# Patient Record
Sex: Male | Born: 1935 | Race: White | Hispanic: No | Marital: Married | Smoking: Never smoker
Health system: Southern US, Community
[De-identification: ages and names within clinical notes are randomized; demographics above are authoritative.]

## PROBLEM LIST (undated history)

## (undated) DIAGNOSIS — E785 Hyperlipidemia, unspecified: Secondary | ICD-10-CM

## (undated) DIAGNOSIS — N400 Enlarged prostate without lower urinary tract symptoms: Secondary | ICD-10-CM

---

## 2011-10-24 ENCOUNTER — Ambulatory Visit: Payer: Self-pay | Admitting: Ophthalmology

## 2011-11-08 ENCOUNTER — Ambulatory Visit: Payer: Self-pay | Admitting: Ophthalmology

## 2012-01-03 ENCOUNTER — Ambulatory Visit: Payer: Self-pay | Admitting: Ophthalmology

## 2012-08-14 ENCOUNTER — Ambulatory Visit: Payer: Self-pay | Admitting: Internal Medicine

## 2012-10-23 ENCOUNTER — Encounter: Payer: Self-pay | Admitting: Neurology

## 2014-11-16 NOTE — Op Note (Signed)
PATIENT NAME:  Austin Ramos, Austin Ramos MR#:  [id number] DATE OF BIRTH:  1926/03/11  DATE OF PROCEDURE:  11/08/2011  PREOPERATIVE DIAGNOSIS: Visually significant cataract of the left eye.   POSTOPERATIVE DIAGNOSIS: Visually significant cataract of the left eye.   OPERATIVE PROCEDURE: Cataract extraction by phacoemulsification with implant of intraocular lens to left eye.   SURGEON: Galen ManilaWilliam Taylee Gunnells, MD.   ANESTHESIA:  1. Managed anesthesia care.  2. Topical tetracaine drops followed by 2% Xylocaine jelly applied in the preoperative holding area.   COMPLICATIONS: None.   TECHNIQUE:  Stop-and-chop    DESCRIPTION OF PROCEDURE: The patient was examined and consented in the preoperative holding area where the aforementioned topical anesthesia was applied to the left eye and then brought back to the Operating Room where the left eye was prepped and draped in the usual sterile ophthalmic fashion and a lid speculum was placed. A paracentesis was created with the side port blade and the anterior chamber was filled with viscoelastic. A near clear corneal incision was performed with the steel keratome. A continuous curvilinear capsulorrhexis was performed with a cystotome followed by the capsulorrhexis forceps. Hydrodissection and hydrodelineation were carried out with BSS on a blunt cannula. The lens was removed in a stop-and-chop technique and the remaining cortical material was removed with the irrigation-aspiration handpiece. The capsular bag was inflated with viscoelastic and the Technus ZCB00 14.5-diopter lens, serial number [id number] was placed in the capsular bag without complication. The remaining viscoelastic was removed from the eye with the irrigation-aspiration handpiece. The wounds were hydrated. The anterior chamber was flushed with Miostat and the eye was inflated to physiologic pressure. The wounds were found to be water tight. The eye was dressed with Vigamox. The patient was given protective  glasses to wear throughout the day and a shield with which to sleep tonight. The patient was also given drops with which to begin a drop regimen today and will follow-up with me in one day.   ____________________________ Jerilee FieldWilliam L. Nita Whitmire, MD wlp:drc D: 11/08/2011 12:17:09 ET T: 11/08/2011 12:37:14 ET JOB#: [id number]  cc: Kashden Deboy L. Alann Avey, MD, <Dictator> Jerilee FieldWILLIAM L Muriel Wilber MD ELECTRONICALLY SIGNED 11/10/2011 8:40

## 2014-11-16 NOTE — Op Note (Signed)
PATIENT NAME:  [name], Austin C MR#:  [id number] DATE OF BIRTH:  07/01/1926  DATE OF PROCEDURE:  01/03/2012  PREOPERATIVE DIAGNOSIS: Visually significant cataract of the right eye.   POSTOPERATIVE DIAGNOSIS: Visually significant cataract of the right eye.   OPERATIVE PROCEDURE: Cataract extraction by phacoemulsification with implant of intraocular lens to right eye.   SURGEON: Galen ManilaWilliam Happy Ky, MD.   ANESTHESIA:  1. Managed anesthesia care.  2. Topical tetracaine drops followed by 2% Xylocaine jelly applied in the preoperative holding area.   COMPLICATIONS: None.   TECHNIQUE:  Stop and chop.   DESCRIPTION OF PROCEDURE: The patient was examined and consented in the preoperative holding area where the aforementioned topical anesthesia was applied to the right eye and then brought back to the Operating Room where the right eye was prepped and draped in the usual sterile ophthalmic fashion and a lid speculum was placed. A paracentesis was created with the side port blade and the anterior chamber was filled with viscoelastic. A near clear corneal incision was performed with the steel keratome. A continuous curvilinear capsulorrhexis was performed with a cystotome followed by the capsulorrhexis forceps. Hydrodissection and hydrodelineation were carried out with BSS on a blunt cannula. The lens was removed in a stop and chop technique and the remaining cortical material was removed with the irrigation-aspiration handpiece. The capsular bag was inflated with viscoelastic and the Tecnis ZCB00 14.0-diopter lens, serial number [id number] was placed in the capsular bag without complication. The remaining viscoelastic was removed from the eye with the irrigation-aspiration handpiece. The wounds were hydrated. The anterior chamber was flushed with Miostat and the eye was inflated to physiologic pressure. The wounds were found to be water tight. The eye was dressed with Vigamox. The patient was given protective  glasses to wear throughout the day and a shield with which to sleep tonight. The patient was also given drops with which to begin a drop regimen today and will follow-up with me in one day.  ____________________________ Jerilee FieldWilliam L. Skyrah Krupp, MD wlp:slb D: 01/03/2012 12:34:06 ET T: 01/03/2012 13:07:41 ET JOB#: [id number]  cc: [name], MD, <Dictator> [name] ELECTRONICALLY SIGNED 01/05/2012 12:12

## 2019-08-26 ENCOUNTER — Other Ambulatory Visit: Payer: Self-pay | Admitting: Internal Medicine

## 2019-08-26 ENCOUNTER — Inpatient Hospital Stay
Admission: EM | Admit: 2019-08-26 | Discharge: 2019-08-30 | DRG: 552 | Disposition: A | Discharge status: Skilled Nursing Facility | Payer: Medicare Other | Source: Ambulatory Visit | Attending: Student | Admitting: Student

## 2019-08-26 ENCOUNTER — Encounter: Payer: Self-pay | Admitting: Emergency Medicine

## 2019-08-26 ENCOUNTER — Emergency Department: Payer: Medicare Other

## 2019-08-26 ENCOUNTER — Ambulatory Visit: Admission: RE | Admit: 2019-08-26 | Payer: Medicare Other | Source: Ambulatory Visit

## 2019-08-26 ENCOUNTER — Other Ambulatory Visit: Payer: Self-pay

## 2019-08-26 DIAGNOSIS — S0101XA Laceration without foreign body of scalp, initial encounter: Secondary | ICD-10-CM | POA: Diagnosis not present

## 2019-08-26 DIAGNOSIS — F0390 Unspecified dementia without behavioral disturbance: Secondary | ICD-10-CM | POA: Diagnosis present

## 2019-08-26 DIAGNOSIS — E785 Hyperlipidemia, unspecified: Secondary | ICD-10-CM | POA: Diagnosis present

## 2019-08-26 DIAGNOSIS — W19XXXA Unspecified fall, initial encounter: Secondary | ICD-10-CM

## 2019-08-26 DIAGNOSIS — W010XXA Fall on same level from slipping, tripping and stumbling without subsequent striking against object, initial encounter: Secondary | ICD-10-CM | POA: Diagnosis present

## 2019-08-26 DIAGNOSIS — N400 Enlarged prostate without lower urinary tract symptoms: Secondary | ICD-10-CM | POA: Diagnosis present

## 2019-08-26 DIAGNOSIS — N401 Enlarged prostate with lower urinary tract symptoms: Secondary | ICD-10-CM

## 2019-08-26 DIAGNOSIS — S22009A Unspecified fracture of unspecified thoracic vertebra, initial encounter for closed fracture: Secondary | ICD-10-CM | POA: Diagnosis present

## 2019-08-26 DIAGNOSIS — S22059A Unspecified fracture of T5-T6 vertebra, initial encounter for closed fracture: Secondary | ICD-10-CM | POA: Diagnosis present

## 2019-08-26 DIAGNOSIS — R634 Abnormal weight loss: Secondary | ICD-10-CM

## 2019-08-26 DIAGNOSIS — S22069A Unspecified fracture of T7-T8 vertebra, initial encounter for closed fracture: Secondary | ICD-10-CM | POA: Diagnosis present

## 2019-08-26 DIAGNOSIS — Z20822 Contact with and (suspected) exposure to covid-19: Secondary | ICD-10-CM | POA: Diagnosis present

## 2019-08-26 DIAGNOSIS — N3943 Post-void dribbling: Secondary | ICD-10-CM

## 2019-08-26 DIAGNOSIS — F0280 Dementia in other diseases classified elsewhere without behavioral disturbance: Secondary | ICD-10-CM

## 2019-08-26 DIAGNOSIS — R0781 Pleurodynia: Secondary | ICD-10-CM

## 2019-08-26 DIAGNOSIS — S22000A Wedge compression fracture of unspecified thoracic vertebra, initial encounter for closed fracture: Secondary | ICD-10-CM | POA: Diagnosis not present

## 2019-08-26 HISTORY — DX: Hyperlipidemia, unspecified: E78.5

## 2019-08-26 HISTORY — DX: Benign prostatic hyperplasia without lower urinary tract symptoms: N40.0

## 2019-08-26 LAB — CBC
HCT: 42.5 % (ref 39.0–52.0)
Hemoglobin: 14.5 g/dL (ref 13.0–17.0)
MCH: 30.8 pg (ref 26.0–34.0)
MCHC: 34.1 g/dL (ref 30.0–36.0)
MCV: 90.2 fL (ref 80.0–100.0)
Platelets: 214 10*3/uL (ref 150–400)
RBC: 4.71 MIL/uL (ref 4.22–5.81)
RDW: 13.4 % (ref 11.5–15.5)
WBC: 8 10*3/uL (ref 4.0–10.5)
nRBC: 0 % (ref 0.0–0.2)

## 2019-08-26 LAB — BASIC METABOLIC PANEL
Anion gap: 9 (ref 5–15)
BUN: 30 mg/dL — ABNORMAL HIGH (ref 8–23)
CO2: 26 mmol/L (ref 22–32)
Calcium: 9.5 mg/dL (ref 8.9–10.3)
Chloride: 100 mmol/L (ref 98–111)
Creatinine, Ser: 0.79 mg/dL (ref 0.61–1.24)
GFR calc Af Amer: >60 mL/min (ref >60)
GFR calc non Af Amer: >60 mL/min (ref >60)
Glucose, Bld: 114 mg/dL — ABNORMAL HIGH (ref 70–99)
Potassium: 4.7 mmol/L (ref 3.5–5.1)
Sodium: 135 mmol/L (ref 135–145)

## 2019-08-26 LAB — RESPIRATORY PANEL BY RT PCR (FLU A&B, COVID)
Influenza A by PCR: NEGATIVE
Influenza B by PCR: NEGATIVE
SARS Coronavirus 2 by RT PCR: NEGATIVE

## 2019-08-26 MED ORDER — GALANTAMINE HYDROBROMIDE 4 MG PO TABS
8.0000 mg | ORAL_TABLET | Freq: Two times a day (BID) | ORAL | Status: DC
  Administered 2019-08-27 – 2019-08-30 (×7): 8 mg via ORAL
  Filled 2019-08-26 (×8): qty 2

## 2019-08-26 MED ORDER — HYDROXYZINE HCL 25 MG PO TABS
25.0000 mg | ORAL_TABLET | Freq: Two times a day (BID) | ORAL | Status: DC
  Administered 2019-08-27 – 2019-08-30 (×7): 25 mg via ORAL
  Filled 2019-08-26 (×7): qty 1

## 2019-08-26 MED ORDER — TAMSULOSIN HCL 0.4 MG PO CAPS
0.4000 mg | ORAL_CAPSULE | Freq: Every day | ORAL | Status: DC
  Administered 2019-08-27 – 2019-08-30 (×4): 0.4 mg via ORAL
  Filled 2019-08-26 (×4): qty 1

## 2019-08-26 MED ORDER — IOHEXOL 300 MG/ML  SOLN
100.0000 mL | Freq: Once | INTRAMUSCULAR | Status: AC | PRN
  Administered 2019-08-26: 21:00:00 100 mL via INTRAVENOUS

## 2019-08-26 MED ORDER — PROPRANOLOL HCL 20 MG PO TABS
20.0000 mg | ORAL_TABLET | Freq: Every day | ORAL | Status: DC
  Administered 2019-08-28 – 2019-08-29 (×2): 20 mg via ORAL
  Filled 2019-08-26 (×4): qty 1

## 2019-08-26 MED ORDER — ACETAMINOPHEN 500 MG PO TABS
1000.0000 mg | ORAL_TABLET | Freq: Once | ORAL | Status: AC
  Administered 2019-08-26: 22:00:00 1000 mg via ORAL
  Filled 2019-08-26: qty 2

## 2019-08-26 MED ORDER — FENTANYL CITRATE (PF) 100 MCG/2ML IJ SOLN
12.5000 ug | Freq: Once | INTRAMUSCULAR | Status: AC
  Administered 2019-08-26: 12.5 ug via INTRAVENOUS
  Filled 2019-08-26: qty 2

## 2019-08-26 MED ORDER — ASCORBIC ACID 500 MG PO TABS
ORAL_TABLET | Freq: Every day | ORAL | Status: DC
  Administered 2019-08-27 – 2019-08-30 (×4): 500 mg via ORAL
  Filled 2019-08-26 (×4): qty 1

## 2019-08-26 MED ORDER — VITAMIN D 25 MCG (1000 UNIT) PO TABS
1000.0000 [IU] | ORAL_TABLET | Freq: Every day | ORAL | Status: DC
  Administered 2019-08-27 – 2019-08-30 (×4): 1000 [IU] via ORAL
  Filled 2019-08-26 (×4): qty 1

## 2019-08-26 MED ORDER — LIDOCAINE-EPINEPHRINE 2 %-1:100000 IJ SOLN
20.0000 mL | Freq: Once | INTRAMUSCULAR | Status: AC
  Administered 2019-08-26: 23:00:00 20 mL via INTRADERMAL
  Filled 2019-08-26: qty 1

## 2019-08-26 MED ORDER — FOLIC ACID 1 MG PO TABS
1.0000 mg | ORAL_TABLET | Freq: Every day | ORAL | Status: DC
  Administered 2019-08-27 – 2019-08-30 (×4): 1 mg via ORAL
  Filled 2019-08-26 (×4): qty 1

## 2019-08-26 NOTE — ED Notes (Signed)
Pt transported to CT ?

## 2019-08-26 NOTE — ED Triage Notes (Signed)
First Nurse Note:  Arrives from [facility] for ED evaluation .  Per Dr. Hyacinth Meeker, patient fell at home 1 week ago and has continued to have thoracic pain with movement, unable to roll over without pain.  Dr. Hyacinth Meeker had planned to send patient to [facility], but patient fell in the office, hitting head.  Laceration to head.  Bandage placed.  Bleeding controlled.

## 2019-08-26 NOTE — ED Notes (Signed)
TO CT scan--- °

## 2019-08-26 NOTE — ED Triage Notes (Signed)
Patient reports multiple falls in the last week. States he is now having soreness to chest and is having a hard time laying down due to pain. Patient also has laceration to back of head. Pressure dressing in place. Bleeding controlled.

## 2019-08-26 NOTE — ED Provider Notes (Addendum)
[facility] Emergency Department Provider Note  ____________________________________________   None    (approximate)  I have reviewed the triage vital signs and the nursing notes.   HISTORY  Chief Complaint Fall    HPI TRUMAINE Ramos is a 86 y.o. male here with fall.  The patient reportedly fell at home 1 week ago.  He states he lost his footing and fell backwards.  Since then, he has had persistently worsening back and anterior chest pain.  The pain has persistently worsened.  He saw his PCP and had a negative plain film.  However, he presented to his doctor again today for further evaluation, at which point a CT scan was ordered.  However, when leaving, due to the pain, he fell, tripped, and fell backwards, striking his head.  He has had mild increase in his pain since then.  No loss of consciousness.  No other medical complaints.  He is not on blood thinners.        Past Medical History:  Diagnosis Date  . BPH (benign prostatic hyperplasia)   . Hyperlipidemia     Patient Active Problem List   Diagnosis Date Noted  . Thoracic vertebral fracture (HCC) 08/26/2019    History reviewed. No pertinent surgical history.  Prior to Admission medications   Medication Sig Start Date End Date Taking? Authorizing Provider  Cholecalciferol 25 MCG (1000 UT) tablet Take 1,000 Units by mouth daily.   Yes [provider]  folic acid (FOLVITE) 1 MG tablet Take 1 mg by mouth daily. 05/31/19  Yes [provider]  galantamine (RAZADYNE) 8 MG tablet Take 8 mg by mouth 2 (two) times daily. 08/24/19  Yes [provider]  hydrOXYzine (ATARAX/VISTARIL) 25 MG tablet Take 25 mg by mouth 2 (two) times daily. 08/24/19  Yes [provider]  Multiple Vitamins-Minerals (EMERGEN-C VITAMIN C PO) Take 1 tablet by mouth daily at 12 noon.    Yes [provider]  NON FORMULARY Take 2 capsules by mouth daily before breakfast. Herbal supplement:  Triveratrol Gold    Yes [provider]  propranolol (INDERAL) 20 MG tablet Take 20 mg by mouth daily at 12 noon.  08/24/19  Yes [provider]  tamsulosin (FLOMAX) 0.4 MG CAPS capsule Take 0.4 mg by mouth daily at 12 noon.  08/06/19  Yes [provider]    Allergies Patient has no known allergies.  No family history on file.  Social History Social History   Tobacco Use  . Smoking status: Never Smoker  . Smokeless tobacco: Never Used  Substance Use Topics  . Alcohol use: Never  . Drug use: Never    Review of Systems  Review of Systems  Constitutional: Positive for fatigue. Negative for chills and fever.  HENT: Negative for sore throat.   Respiratory: Negative for shortness of breath.   Cardiovascular: Positive for chest pain.  Gastrointestinal: Negative for abdominal pain.  Genitourinary: Negative for flank pain.  Musculoskeletal: Positive for back pain and gait problem. Negative for neck pain.  Skin: Negative for rash and wound.  Allergic/Immunologic: Negative for immunocompromised state.  Neurological: Negative for weakness and numbness.  Hematological: Does not bruise/bleed easily.  All other systems reviewed and are negative.    ____________________________________________  PHYSICAL EXAM:      VITAL SIGNS: ED Triage Vitals  Enc Vitals Group     BP 08/26/19 1802 (!) 155/64     Pulse Rate 08/26/19 1802 (!) 58  Resp 08/26/19 1802 16     Temp 08/26/19 1802 98.1 F (36.7 C)     Temp Source 08/26/19 1802 Oral     SpO2 08/26/19 1802 97 %     Weight 08/26/19 1803 115 lb (52.2 kg)     Height 08/26/19 1803 5\' 10"  (1.778 m)     Head Circumference --      Peak Flow --      Pain Score 08/26/19 1803 4     Pain Loc --      Pain Edu? --      Excl. in [facility]? --      Physical Exam Vitals and nursing note reviewed.  Constitutional:      General: He is not in acute distress.    Appearance: He is well-developed.  HENT:     Head:  Normocephalic and atraumatic.  Eyes:     Conjunctiva/sclera: Conjunctivae normal.  Cardiovascular:     Rate and Rhythm: Normal rate and regular rhythm.     Heart sounds: Normal heart sounds. No murmur. No friction rub.     Comments: Moderate anterior chest wall tenderness. Pulmonary:     Effort: Pulmonary effort is normal. No respiratory distress.     Breath sounds: Normal breath sounds. No wheezing or rales.  Abdominal:     General: There is no distension.     Palpations: Abdomen is soft.     Tenderness: There is no abdominal tenderness.  Musculoskeletal:     Cervical back: Neck supple.     Comments: Moderate tenderness to palpation throughout the thoracic spine.  No step-offs or deformity.  Skin:    General: Skin is warm.     Capillary Refill: Capillary refill takes less than 2 seconds.  Neurological:     Mental Status: He is alert and oriented to person, place, and time.     Motor: No abnormal muscle tone.       ____________________________________________   LABS (all labs ordered are listed, but only abnormal results are displayed)  Labs Reviewed  BASIC METABOLIC PANEL - Abnormal; Notable for the following components:      Result Value   Glucose, Bld 114 (*)    BUN 30 (*)    All other components within normal limits  RESPIRATORY PANEL BY RT PCR (FLU A&B, COVID)  CBC  BASIC METABOLIC PANEL  CBC    ____________________________________________  EKG: Sinus bradycardia, ventricular rate 59.  PR 196, QRS 98, QTc 413.  No acute ST elevations or depressions. ________________________________________  RADIOLOGY All imaging, including plain films, CT scans, and ultrasounds, independently reviewed by me, and interpretations confirmed via formal radiology reads.  ED MD interpretation:   Chest x-ray: Negative CT head: No acute abnormality CT chest/abdomen/pelvis.  Nondisplaced fracture of the anterior inferior endplate of T6, compression deformity of T8 with 25% height  loss  Official radiology report(s): DG Chest 2 View  Result Date: 08/26/2019 CLINICAL DATA:  Chest pain, fall EXAM: CHEST - 2 VIEW COMPARISON:  None. FINDINGS: The heart size and mediastinal contours are within normal limits. Aortic knob calcifications. Elevation of the left hemidiaphragm with gastric bubble and air-filled loops of bowel. Degenerative changes in the midthoracic spine with anterior flowing osteophytes. IMPRESSION: No active cardiopulmonary disease. Electronically Signed   By: Prudencio Pair M.D.   On: 08/26/2019 18:45   CT HEAD WO CONTRAST  Result Date: 08/26/2019 CLINICAL DATA:  Head trauma, headache EXAM: CT HEAD WITHOUT CONTRAST TECHNIQUE: Contiguous axial images were obtained from  the base of the skull through the vertex without intravenous contrast. COMPARISON:  None. FINDINGS: Brain: No evidence of acute territorial infarction, hemorrhage, hydrocephalus,extra-axial collection or mass lesion/mass effect. There is dilatation the ventricles and sulci consistent with age-related atrophy. Low-attenuation changes in the deep white matter consistent with small vessel ischemia. Vascular: No hyperdense vessel or unexpected calcification. Skull: The skull is intact. No fracture or focal lesion identified. Sinuses/Orbits: The visualized paranasal sinuses and mastoid air cells are clear. The orbits and globes intact. Other: None IMPRESSION: No acute intracranial abnormality. Findings consistent with age related atrophy and chronic small vessel ischemia Electronically Signed   By: Jonna ClarkBindu  Avutu M.D.   On: 08/26/2019 18:39   CT Chest W Contrast  Result Date: 08/26/2019 CLINICAL DATA:  Abdominal trauma, fall 1 week ago, continued pain EXAM: CT CHEST WITH CONTRAST TECHNIQUE: Multidetector CT imaging of the chest was performed during intravenous contrast administration. CONTRAST:  100mL OMNIPAQUE IOHEXOL 300 MG/ML  SOLN COMPARISON:  None. FINDINGS: Cardiovascular: Normal heart size. No significant  pericardial fluid/thickening. Great vessels are normal in course and caliber. No evidence of acute thoracic aortic injury. No central pulmonary emboli. There is mild aneurysmal dilatation of the ascending intrathoracic aorta measuring 4 cm in maximum transverse dimension which tapers at the level of the aortic arch. Scattered aortic atherosclerosis is seen. Coronary artery calcifications are noted. Mediastinum/Nodes: No pneumomediastinum. No mediastinal hematoma. Unremarkable esophagus. No axillary, mediastinal or hilar lymphadenopathy. Lungs/Pleura:Minimal atelectasis or scarring is seen at the left lung base. There is elevation of the left hemidiaphragm. No pneumothorax. No pleural effusion. Musculoskeletal: There is an S-shaped scoliotic curvature of the thoracolumbar spine. There is a nondisplaced fracture seen through the anteroinferior endplate of the T6 vertebral body which extends through the mid vertebral body. No extension to the posterior elements is seen. No malalignment is noted. Superior compression deformity of the T8 vertebral bodies seen with less than 25% loss in height. No retropulsion of fragments is seen. There is diffuse osteopenia. Degenerative changes are seen through the thoracolumbar spine. Abdomen/pelvis: Hepatobiliary: Homogeneous hepatic attenuation without traumatic injury. No focal lesion. Gallbladder physiologically distended, no calcified stone. No biliary dilatation. Pancreas: No evidence for traumatic injury. Portions are partially obscured by adjacent bowel loops and paucity of intra-abdominal fat. No ductal dilatation or inflammation. Spleen: Homogeneous attenuation without traumatic injury. Normal in size. Adrenals/Urinary Tract: No adrenal hemorrhage. Kidneys demonstrate symmetric enhancement and excretion on delayed phase imaging. No evidence or renal injury. Ureters are well opacified proximal through mid portion. Bladder is physiologically distended without wall thickening.  Stomach/Bowel: Suboptimally assessed without enteric contrast, allowing for this, no evidence of bowel injury. Stomach physiologically distended. There are no dilated or thickened small or large bowel loops. Moderate stool burden. No evidence of mesenteric hematoma. No free air free fluid. Vascular/Lymphatic: No acute vascular injury. The abdominal aorta and IVC are intact. No evidence of retroperitoneal, abdominal, or pelvic adenopathy. Scattered aortic atherosclerosis is noted. For Reproductive: No acute abnormality. Other: No focal contusion or abnormality of the abdominal wall. Musculoskeletal: No acute fracture of the lumbar spine or bony pelvis. IMPRESSION: 1. Nondisplaced fracture through the anteroinferior endplate and mid body of T6. No extension to the posterior elements or malalignment. 2. Superior compression deformity of the T8 vertebral body with less than 25% loss in height. 3. No acute intrathoracic, abdominal, or pelvic injury. 4. 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 Recommend annual imaging followup by CTA. This recommendation follows 2010 ACCF/AHA/AATS/ACR/ASA/SCA/SCAI/SIR/STS/SVM Guidelines  for the Diagnosis and Management of Patients with Thoracic Aortic Disease. Circulation. 2010; 121: W098-J191: [id number]. Aortic aneurysm NOS (ICD10-I71.9) 5.  Aortic Atherosclerosis (ICD10-I70.0). Electronically Signed   By: Jonna ClarkBindu  Avutu M.D.   On: 08/26/2019 21:15   CT Cervical Spine Wo Contrast  Result Date: 08/26/2019 CLINICAL DATA:  Fall 1 week ago with persistent neck pain, initial encounter EXAM: CT CERVICAL SPINE WITHOUT CONTRAST TECHNIQUE: Multidetector CT imaging of the cervical spine was performed without intravenous contrast. Multiplanar CT image reconstructions were also generated. COMPARISON:  None. FINDINGS: Alignment: Within normal limits. Skull base and vertebrae: 7 cervical segments are well visualized. Vertebral body height is well maintained. Multilevel  facet hypertrophic changes are seen as well as osteophytic changes. No acute fracture or acute facet abnormality is noted. Disc space narrowing is seen throughout the cervical spine. Soft tissues and spinal canal: Vascular calcifications are seen. No focal hematoma is noted. No other soft tissue abnormality is noted. Upper chest: Visualized lung apices are within normal limits. Other: None IMPRESSION: Multilevel degenerative change without acute abnormality. Electronically Signed   By: Alcide CleverMark  Lukens M.D.   On: 08/26/2019 21:09   CT ABDOMEN PELVIS W CONTRAST  Result Date: 08/26/2019 CLINICAL DATA:  Abdominal trauma, fall 1 week ago, continued pain EXAM: CT CHEST WITH CONTRAST TECHNIQUE: Multidetector CT imaging of the chest was performed during intravenous contrast administration. CONTRAST:  100mL OMNIPAQUE IOHEXOL 300 MG/ML  SOLN COMPARISON:  None. FINDINGS: Cardiovascular: Normal heart size. No significant pericardial fluid/thickening. Great vessels are normal in course and caliber. No evidence of acute thoracic aortic injury. No central pulmonary emboli. There is mild aneurysmal dilatation of the ascending intrathoracic aorta measuring 4 cm in maximum transverse dimension which tapers at the level of the aortic arch. Scattered aortic atherosclerosis is seen. Coronary artery calcifications are noted. Mediastinum/Nodes: No pneumomediastinum. No mediastinal hematoma. Unremarkable esophagus. No axillary, mediastinal or hilar lymphadenopathy. Lungs/Pleura:Minimal atelectasis or scarring is seen at the left lung base. There is elevation of the left hemidiaphragm. No pneumothorax. No pleural effusion. Musculoskeletal: There is an S-shaped scoliotic curvature of the thoracolumbar spine. There is a nondisplaced fracture seen through the anteroinferior endplate of the T6 vertebral body which extends through the mid vertebral body. No extension to the posterior elements is seen. No malalignment is noted. Superior  compression deformity of the T8 vertebral bodies seen with less than 25% loss in height. No retropulsion of fragments is seen. There is diffuse osteopenia. Degenerative changes are seen through the thoracolumbar spine. Abdomen/pelvis: Hepatobiliary: Homogeneous hepatic attenuation without traumatic injury. No focal lesion. Gallbladder physiologically distended, no calcified stone. No biliary dilatation. Pancreas: No evidence for traumatic injury. Portions are partially obscured by adjacent bowel loops and paucity of intra-abdominal fat. No ductal dilatation or inflammation. Spleen: Homogeneous attenuation without traumatic injury. Normal in size. Adrenals/Urinary Tract: No adrenal hemorrhage. Kidneys demonstrate symmetric enhancement and excretion on delayed phase imaging. No evidence or renal injury. Ureters are well opacified proximal through mid portion. Bladder is physiologically distended without wall thickening. Stomach/Bowel: Suboptimally assessed without enteric contrast, allowing for this, no evidence of bowel injury. Stomach physiologically distended. There are no dilated or thickened small or large bowel loops. Moderate stool burden. No evidence of mesenteric hematoma. No free air free fluid. Vascular/Lymphatic: No acute vascular injury. The abdominal aorta and IVC are intact. No evidence of retroperitoneal, abdominal, or pelvic adenopathy. Scattered aortic atherosclerosis is noted. For Reproductive: No acute abnormality. Other: No focal contusion or abnormality of the abdominal wall. Musculoskeletal: No acute  fracture of the lumbar spine or bony pelvis. IMPRESSION: 1. Nondisplaced fracture through the anteroinferior endplate and mid body of T6. No extension to the posterior elements or malalignment. 2. Superior compression deformity of the T8 vertebral body with less than 25% loss in height. 3. No acute intrathoracic, abdominal, or pelvic injury. 4. Mild aneurysmal dilatation of the ascending  intrathoracic aorta measuring 4.0 cm. Recommend annual imaging followup by CTA. This recommendation follows 2010 ACCF/AHA/AATS/ACR/ASA/SCA/SCAI/SIR/STS/SVM Guidelines for the Diagnosis and Management of Patients with Thoracic Aortic Disease. Circulation. 2010; 121: P233-A076. Aortic aneurysm NOS (ICD10-I71.9) 5.  Aortic Atherosclerosis (ICD10-I70.0). Electronically Signed   By: Jonna Clark M.D.   On: 08/26/2019 21:15    ____________________________________________  PROCEDURES   Procedure(s) performed (including Critical Care):  Marland KitchenMarland KitchenLaceration Repair  Date/Time: 08/27/2019 1:23 AM Performed by: Shaune Pollack, MD Authorized by: Shaune Pollack, MD   Consent:    Consent obtained:  Verbal   Consent given by:  Patient   Risks discussed:  Infection, need for additional repair, pain, tendon damage, retained foreign body, vascular damage, poor cosmetic result, poor wound healing and nerve damage   Alternatives discussed:  Referral and delayed treatment Anesthesia (see MAR for exact dosages):    Anesthesia method:  Local infiltration   Local anesthetic:  Lidocaine 1% WITH epi Laceration details:    Location:  Scalp   Scalp location:  Occipital   Length (cm):  2 Repair type:    Repair type:  Simple Pre-procedure details:    Preparation:  Patient was prepped and draped in usual sterile fashion and imaging obtained to evaluate for foreign bodies Exploration:    Hemostasis achieved with:  Direct pressure   Wound exploration: wound explored through full range of motion and entire depth of wound probed and visualized   Treatment:    Area cleansed with:  Betadine   Amount of cleaning:  Extensive   Irrigation solution:  Sterile water   Irrigation volume:  250   Irrigation method:  Pressure wash Skin repair:    Repair method:  Staples   Number of staples:  3 Approximation:    Approximation:  Close Post-procedure details:    Dressing:  Antibiotic ointment   Patient tolerance of procedure:   Tolerated well, no immediate complications    ____________________________________________  INITIAL IMPRESSION / MDM / ASSESSMENT AND PLAN / ED COURSE  As part of my medical decision making, I reviewed the following data within the electronic MEDICAL RECORD NUMBER Nursing notes reviewed and incorporated, Old chart reviewed, Notes from prior ED visits, and [place] Controlled Substance Database       *Austin Ramos was evaluated in Emergency Department on 08/27/2019 for the symptoms described in the history of present illness. He was evaluated in the context of the global COVID-19 pandemic, which necessitated consideration that the patient might be at risk for infection with the SARS-CoV-2 virus that causes COVID-19. Institutional protocols and algorithms that pertain to the evaluation of patients at risk for COVID-19 are in a state of rapid change based on information released by regulatory bodies including the CDC and federal and state organizations. These policies and algorithms were followed during the patient's care in the ED.  Some ED evaluations and interventions may be delayed as a result of limited staffing during the pandemic.*     Medical Decision Making:  86 yo M here with back and anterior chest pain after fall x 2. Pt also with deep head laceration, surrounding contusion. Lac repaired, tetanus updated. CT  C/A/P shows nondispaced fx through T6, T8. Incidental aneurysm noted. CXR shows no acute abnormality. Discussed CT findings with Dr. Adriana Simas of NSGY. Will place in [facility] with outpt follow-up in 3 weeks. Otherwise, will admit for pain control, PT/OT given age, independent living status. ____________________________________________  FINAL CLINICAL IMPRESSION(S) / ED DIAGNOSES  Final diagnoses:  Fall, initial encounter  Compression fracture of thoracic vertebra, initial encounter, unspecified thoracic vertebral level (HCC)  Laceration of scalp, initial encounter     MEDICATIONS GIVEN DURING  THIS VISIT:  Medications  propranolol (INDERAL) tablet 20 mg (has no administration in time range)  galantamine (RAZADYNE) tablet 8 mg (has no administration in time range)  hydrOXYzine (ATARAX/VISTARIL) tablet 25 mg (has no administration in time range)  tamsulosin (FLOMAX) capsule 0.4 mg (has no administration in time range)  folic acid (FOLVITE) tablet 1 mg (has no administration in time range)  Cholecalciferol 1,000 Units (has no administration in time range)  Emergen-C Vitamin C PACK (has no administration in time range)  enoxaparin (LOVENOX) injection 40 mg (has no administration in time range)  0.9 %  sodium chloride infusion (has no administration in time range)  acetaminophen (TYLENOL) tablet 650 mg (has no administration in time range)    Or  acetaminophen (TYLENOL) suppository 650 mg (has no administration in time range)  traZODone (DESYREL) tablet 25 mg (has no administration in time range)  magnesium hydroxide (MILK OF MAGNESIA) suspension 30 mL (has no administration in time range)  ondansetron (ZOFRAN) tablet 4 mg (has no administration in time range)    Or  ondansetron (ZOFRAN) injection 4 mg (has no administration in time range)  morphine 2 MG/ML injection 2 mg (has no administration in time range)  oxyCODONE-acetaminophen (PERCOCET/ROXICET) 5-325 MG per tablet 1-2 tablet (has no administration in time range)  lidocaine-EPINEPHrine (XYLOCAINE W/EPI) 2 %-1:100000 (with pres) injection 20 mL (20 mLs Intradermal Given by Other 08/26/19 2311)  iohexol (OMNIPAQUE) 300 MG/ML solution 100 mL (100 mLs Intravenous Contrast Given 08/26/19 2046)  acetaminophen (TYLENOL) tablet 1,000 mg (1,000 mg Oral Given 08/26/19 2202)  fentaNYL (SUBLIMAZE) injection 12.5 mcg (12.5 mcg Intravenous Given 08/26/19 2336)     ED Discharge Orders    None       Note:  This document was prepared using Dragon voice recognition software and may include unintentional dictation errors.   Shaune Pollack,  MD 08/27/19 Helene Shoe    Shaune Pollack, MD 08/27/19 [phone]

## 2019-08-27 DIAGNOSIS — S0101XA Laceration without foreign body of scalp, initial encounter: Secondary | ICD-10-CM

## 2019-08-27 LAB — BASIC METABOLIC PANEL
Anion gap: 8 (ref 5–15)
BUN: 22 mg/dL (ref 8–23)
CO2: 26 mmol/L (ref 22–32)
Calcium: 8.9 mg/dL (ref 8.9–10.3)
Chloride: 102 mmol/L (ref 98–111)
Creatinine, Ser: 0.73 mg/dL (ref 0.61–1.24)
GFR calc Af Amer: >60 mL/min (ref >60)
GFR calc non Af Amer: >60 mL/min (ref >60)
Glucose, Bld: 98 mg/dL (ref 70–99)
Potassium: 3.9 mmol/L (ref 3.5–5.1)
Sodium: 136 mmol/L (ref 135–145)

## 2019-08-27 LAB — CBC
HCT: 41.4 % (ref 39.0–52.0)
Hemoglobin: 14 g/dL (ref 13.0–17.0)
MCH: 30.6 pg (ref 26.0–34.0)
MCHC: 33.8 g/dL (ref 30.0–36.0)
MCV: 90.4 fL (ref 80.0–100.0)
Platelets: 184 10*3/uL (ref 150–400)
RBC: 4.58 MIL/uL (ref 4.22–5.81)
RDW: 13.3 % (ref 11.5–15.5)
WBC: 8 10*3/uL (ref 4.0–10.5)
nRBC: 0 % (ref 0.0–0.2)

## 2019-08-27 MED ORDER — MAGNESIUM HYDROXIDE 400 MG/5ML PO SUSP: 30.0000 mL | Freq: Every day | ORAL | Status: DC | PRN

## 2019-08-27 MED ORDER — ACETAMINOPHEN 650 MG RE SUPP: 650.0000 mg | Freq: Four times a day (QID) | RECTAL | Status: DC | PRN

## 2019-08-27 MED ORDER — OXYCODONE-ACETAMINOPHEN 5-325 MG PO TABS: 1.0000 | ORAL_TABLET | ORAL | Status: DC | PRN

## 2019-08-27 MED ORDER — TRAZODONE HCL 50 MG PO TABS: 25.0000 mg | ORAL_TABLET | Freq: Every evening | ORAL | Status: DC | PRN

## 2019-08-27 MED ORDER — ENOXAPARIN SODIUM 40 MG/0.4ML ~~LOC~~ SOLN
40.0000 mg | SUBCUTANEOUS | Status: DC
  Administered 2019-08-27: 40 mg via SUBCUTANEOUS
  Filled 2019-08-27: qty 0.4

## 2019-08-27 MED ORDER — SODIUM CHLORIDE 0.9 % IV SOLN: INTRAVENOUS | Status: DC

## 2019-08-27 MED ORDER — ONDANSETRON HCL 4 MG PO TABS: 4.0000 mg | ORAL_TABLET | Freq: Four times a day (QID) | ORAL | Status: DC | PRN

## 2019-08-27 MED ORDER — ENSURE ENLIVE PO LIQD
237.0000 mL | Freq: Two times a day (BID) | ORAL | Status: DC
  Administered 2019-08-27 – 2019-08-30 (×5): 237 mL via ORAL

## 2019-08-27 MED ORDER — ACETAMINOPHEN 325 MG PO TABS
650.0000 mg | ORAL_TABLET | Freq: Four times a day (QID) | ORAL | Status: DC | PRN
  Administered 2019-08-28: 07:00:00 650 mg via ORAL
  Filled 2019-08-27: qty 2

## 2019-08-27 MED ORDER — ONDANSETRON HCL 4 MG/2ML IJ SOLN: 4.0000 mg | Freq: Four times a day (QID) | INTRAMUSCULAR | Status: DC | PRN

## 2019-08-27 MED ORDER — MORPHINE SULFATE (PF) 2 MG/ML IV SOLN
2.0000 mg | INTRAVENOUS | Status: DC | PRN
  Administered 2019-08-28: 2 mg via INTRAVENOUS
  Filled 2019-08-27: qty 1

## 2019-08-27 NOTE — H&P (Addendum)
[place] at [facility]   PATIENT NAME: Austin Ramos    MR#:  [id number]  DATE OF BIRTH:  Dec 28, 1925  DATE OF ADMISSION:  08/26/2019  PRIMARY CARE PHYSICIAN: Danella Penton, MD   REQUESTING/REFERRING PHYSICIAN: Shaune Pollack, MD CHIEF COMPLAINT:   Chief Complaint  Patient presents with  . Fall    HISTORY OF PRESENT ILLNESS:  Austin Ramos  is a 86 y.o. male with a known history of dyslipidemia and BPH, presented to the emergency room with acute onset of fall while he was at his primary care physician's office after coming out of the bathroom and turning them losing his balance and falling hitting his forehead.  There was no loss of consciousness, paresthesias other neurological deficits.  He then complained of mid back pain.  He had another fall about a week ago without significant injuries.  He complained of chest pain however after that he could not get out of bed and therefore he saw his PCP yesterday and was ordered chest CTA.  No nausea or vomiting or abdominal pain.  No dysuria, oliguria or hematuria or flank pain.  His chest pain with better.  No dysuria, oliguria or hematuria or flank pain.  Upon presentation to the emergency room, blood pressure was 155/64 with a pulse of 58 and otherwise normal vital signs.  Labs revealed a BUN of 30 and influenza a and B antigens as well as Covid nineteen 2-hour PCR came back negative.  The patient had an abdominal chest and cervical spine CT that showed: 1. Nondisplaced fracture through the anteroinferior endplate and mid body of T6. No extension to the posterior elements or malalignment. 2. Superior compression deformity of the T8 vertebral body with less than 25% loss in height. 3. No acute intrathoracic, abdominal, or pelvic injury. 4. 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 Recommend annual imaging followup by CTA. This recommendation follows 2010  ACCF/AHA/AATS/ACR/ASA/SCA/SCAI/SIR/STS/SVM Guidelines for the Diagnosis and Management of Patients with Thoracic Aortic Disease. Circulation. 2010; 121: A416-S063. Aortic aneurysm NOS. 5.  Aortic Atherosclerosis.  Dr. Adriana Simas was contacted about the patient and is aware.  The patient was given 1 g of p.o. Tylenol, 12.5 mg of IV fentanyl, 1 mg of folic acid and his galantamine, Inderal and Flomax.  He will be admitted to a medical bed for further evaluation and management.  PAST MEDICAL HISTORY:   Past Medical History:  Diagnosis Date  . BPH (benign prostatic hyperplasia)   . Hyperlipidemia     PAST SURGICAL HISTORY:  History reviewed. No pertinent surgical history.  No reported surgeries. SOCIAL HISTORY:   Social History   Tobacco Use  . Smoking status: Never Smoker  . Smokeless tobacco: Never Used  Substance Use Topics  . Alcohol use: Never    FAMILY HISTORY:  No family history on file.  No reported familial diseases.  DRUG ALLERGIES:  No Known Allergies  REVIEW OF SYSTEMS:   ROS As per history of present illness. All pertinent systems were reviewed above. Constitutional,  HEENT, cardiovascular, respiratory, GI, GU, musculoskeletal, neuro, psychiatric, endocrine,  integumentary and hematologic systems were reviewed and are otherwise  negative/unremarkable except for positive findings mentioned above in the HPI.   MEDICATIONS AT HOME:   Prior to Admission medications   Medication Sig Start Date End Date Taking? Authorizing Provider  Cholecalciferol 25 MCG (1000 UT) tablet Take 1,000 Units by mouth daily.   Yes [provider]  folic acid (FOLVITE) 1  MG tablet Take 1 mg by mouth daily. 05/31/19  Yes [provider]  galantamine (RAZADYNE) 8 MG tablet Take 8 mg by mouth 2 (two) times daily. 08/24/19  Yes [provider]  hydrOXYzine (ATARAX/VISTARIL) 25 MG tablet Take 25 mg by mouth 2 (two) times daily. 08/24/19  Yes [provider]    Multiple Vitamins-Minerals (EMERGEN-C VITAMIN C PO) Take 1 tablet by mouth daily at 12 noon.    Yes [provider]  NON FORMULARY Take 2 capsules by mouth daily before breakfast. Herbal supplement: Triveratrol Gold    Yes [provider]  propranolol (INDERAL) 20 MG tablet Take 20 mg by mouth daily at 12 noon.  08/24/19  Yes [provider]  tamsulosin (FLOMAX) 0.4 MG CAPS capsule Take 0.4 mg by mouth daily at 12 noon.  08/06/19  Yes [provider]      VITAL SIGNS:  Blood pressure (!) 148/45, pulse 62, temperature (!) 97.3 F (36.3 C), temperature source Oral, resp. rate 18, height 5\' 10"  (1.778 m), weight 52.2 kg, SpO2 98 %.  PHYSICAL EXAMINATION:  Physical Exam  GENERAL:  86 y.o.-year-old Caucasian patient lying in the bed with no acute distress.  EYES: Pupils equal, round, reactive to light and accommodation. No scleral icterus. Extraocular muscles intact.  HEENT: Head atraumatic, normocephalic. Oropharynx and nasopharynx clear.  NECK:  Supple, no jugular venous distention. No thyroid enlargement, no tenderness.  LUNGS: Normal breath sounds bilaterally, no wheezing, rales,rhonchi or crepitation. No use of accessory muscles of respiration.  CARDIOVASCULAR: Regular rate and rhythm, S1, S2 normal. No murmurs, rubs, or gallops.  ABDOMEN: Soft, nondistended, nontender. Bowel sounds present. No organomegaly or mass.  EXTREMITIES: No pedal edema, cyanosis, or clubbing.  NEUROLOGIC: Cranial nerves II through XII are intact. Muscle strength 5/5 in all extremities. Sensation intact. Gait not checked. Musculoskeletal: Mid and lower thoracic spine tenderness PSYCHIATRIC: The patient is alert and oriented x 3.  Normal affect and good eye contact. SKIN: No obvious rash, lesion, or ulcer.   LABORATORY PANEL:   CBC Recent Labs  Lab 08/26/19 1809  WBC 8.0  HGB 14.5  HCT 42.5  PLT 214    ------------------------------------------------------------------------------------------------------------------  Chemistries  Recent Labs  Lab 08/26/19 1809  NA 135  K 4.7  CL 100  CO2 26  GLUCOSE 114*  BUN 30*  CREATININE 0.79  CALCIUM 9.5   ------------------------------------------------------------------------------------------------------------------  Cardiac Enzymes No results for input(s): TROPONINI in the last 168 hours. ------------------------------------------------------------------------------------------------------------------  RADIOLOGY:  DG Chest 2 View  Result Date: 08/26/2019 CLINICAL DATA:  Chest pain, fall EXAM: CHEST - 2 VIEW COMPARISON:  None. FINDINGS: The heart size and mediastinal contours are within normal limits. Aortic knob calcifications. Elevation of the left hemidiaphragm with gastric bubble and air-filled loops of bowel. Degenerative changes in the midthoracic spine with anterior flowing osteophytes. IMPRESSION: No active cardiopulmonary disease. Electronically Signed   By: 10/24/2019 M.D.   On: 08/26/2019 18:45   CT HEAD WO CONTRAST  Result Date: 08/26/2019 CLINICAL DATA:  Head trauma, headache EXAM: CT HEAD WITHOUT CONTRAST TECHNIQUE: Contiguous axial images were obtained from the base of the skull through the vertex without intravenous contrast. COMPARISON:  None. FINDINGS: Brain: No evidence of acute territorial infarction, hemorrhage, hydrocephalus,extra-axial collection or mass lesion/mass effect. There is dilatation the ventricles and sulci consistent with age-related atrophy. Low-attenuation changes in the deep white matter consistent with small vessel ischemia. Vascular: No hyperdense vessel or unexpected calcification. Skull: The skull is intact. No fracture  or focal lesion identified. Sinuses/Orbits: The visualized paranasal sinuses and mastoid air cells are clear. The orbits and globes intact. Other: None IMPRESSION: No acute  intracranial abnormality. Findings consistent with age related atrophy and chronic small vessel ischemia Electronically Signed   By: Jonna Clark M.D.   On: 08/26/2019 18:39   CT Chest W Contrast  Result Date: 08/26/2019 CLINICAL DATA:  Abdominal trauma, fall 1 week ago, continued pain EXAM: CT CHEST WITH CONTRAST TECHNIQUE: Multidetector CT imaging of the chest was performed during intravenous contrast administration. CONTRAST:  OMNIPAQUE IOHEXOL 300 MG/ML  SOLN COMPARISON:  None. FINDINGS: Cardiovascular: Normal heart size. No significant pericardial fluid/thickening. Great vessels are normal in course and caliber. No evidence of acute thoracic aortic injury. No central pulmonary emboli. There is mild aneurysmal dilatation of the ascending intrathoracic aorta measuring 4 cm in maximum transverse dimension which tapers at the level of the aortic arch. Scattered aortic atherosclerosis is seen. Coronary artery calcifications are noted. Mediastinum/Nodes: No pneumomediastinum. No mediastinal hematoma. Unremarkable esophagus. No axillary, mediastinal or hilar lymphadenopathy. Lungs/Pleura:Minimal atelectasis or scarring is seen at the left lung base. There is elevation of the left hemidiaphragm. No pneumothorax. No pleural effusion. Musculoskeletal: There is an S-shaped scoliotic curvature of the thoracolumbar spine. There is a nondisplaced fracture seen through the anteroinferior endplate of the T6 vertebral body which extends through the mid vertebral body. No extension to the posterior elements is seen. No malalignment is noted. Superior compression deformity of the T8 vertebral bodies seen with less than 25% loss in height. No retropulsion of fragments is seen. There is diffuse osteopenia. Degenerative changes are seen through the thoracolumbar spine. Abdomen/pelvis: Hepatobiliary: Homogeneous hepatic attenuation without traumatic injury. No focal lesion. Gallbladder physiologically distended, no calcified  stone. No biliary dilatation. Pancreas: No evidence for traumatic injury. Portions are partially obscured by adjacent bowel loops and paucity of intra-abdominal fat. No ductal dilatation or inflammation. Spleen: Homogeneous attenuation without traumatic injury. Normal in size. Adrenals/Urinary Tract: No adrenal hemorrhage. Kidneys demonstrate symmetric enhancement and excretion on delayed phase imaging. No evidence or renal injury. Ureters are well opacified proximal through mid portion. Bladder is physiologically distended without wall thickening. Stomach/Bowel: Suboptimally assessed without enteric contrast, allowing for this, no evidence of bowel injury. Stomach physiologically distended. There are no dilated or thickened small or large bowel loops. Moderate stool burden. No evidence of mesenteric hematoma. No free air free fluid. Vascular/Lymphatic: No acute vascular injury. The abdominal aorta and IVC are intact. No evidence of retroperitoneal, abdominal, or pelvic adenopathy. Scattered aortic atherosclerosis is noted. For Reproductive: No acute abnormality. Other: No focal contusion or abnormality of the abdominal wall. Musculoskeletal: No acute fracture of the lumbar spine or bony pelvis. IMPRESSION: 1. Nondisplaced fracture through the anteroinferior endplate and mid body of T6. No extension to the posterior elements or malalignment. 2. Superior compression deformity of the T8 vertebral body with less than 25% loss in height. 3. No acute intrathoracic, abdominal, or pelvic injury. 4. 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 Recommend annual imaging followup by CTA. This recommendation follows 2010 ACCF/AHA/AATS/ACR/ASA/SCA/SCAI/SIR/STS/SVM Guidelines for the Diagnosis and Management of Patients with Thoracic Aortic Disease. Circulation. 2010; 121: F573-U202. Aortic aneurysm NOS (ICD10-I71.9) 5.  Aortic Atherosclerosis (ICD10-I70.0). Electronically Signed   By: Jonna Clark M.D.   On: 08/26/2019 21:15   CT Cervical Spine Wo Contrast  Result Date: 08/26/2019 CLINICAL DATA:  Fall 1 week ago with persistent neck pain, initial encounter EXAM: CT CERVICAL  SPINE WITHOUT CONTRAST TECHNIQUE: Multidetector CT imaging of the cervical spine was performed without intravenous contrast. Multiplanar CT image reconstructions were also generated. COMPARISON:  None. FINDINGS: Alignment: Within normal limits. Skull base and vertebrae: 7 cervical segments are well visualized. Vertebral body height is well maintained. Multilevel facet hypertrophic changes are seen as well as osteophytic changes. No acute fracture or acute facet abnormality is noted. Disc space narrowing is seen throughout the cervical spine. Soft tissues and spinal canal: Vascular calcifications are seen. No focal hematoma is noted. No other soft tissue abnormality is noted. Upper chest: Visualized lung apices are within normal limits. Other: None IMPRESSION: Multilevel degenerative change without acute abnormality. Electronically Signed   By: Alcide CleverMark  Lukens M.D.   On: 08/26/2019 21:09   CT ABDOMEN PELVIS W CONTRAST  Result Date: 08/26/2019 CLINICAL DATA:  Abdominal trauma, fall 1 week ago, continued pain EXAM: CT CHEST WITH CONTRAST TECHNIQUE: Multidetector CT imaging of the chest was performed during intravenous contrast administration. CONTRAST:  100mL OMNIPAQUE IOHEXOL 300 MG/ML  SOLN COMPARISON:  None. FINDINGS: Cardiovascular: Normal heart size. No significant pericardial fluid/thickening. Great vessels are normal in course and caliber. No evidence of acute thoracic aortic injury. No central pulmonary emboli. There is mild aneurysmal dilatation of the ascending intrathoracic aorta measuring 4 cm in maximum transverse dimension which tapers at the level of the aortic arch. Scattered aortic atherosclerosis is seen. Coronary artery calcifications are noted. Mediastinum/Nodes: No pneumomediastinum. No mediastinal hematoma.  Unremarkable esophagus. No axillary, mediastinal or hilar lymphadenopathy. Lungs/Pleura:Minimal atelectasis or scarring is seen at the left lung base. There is elevation of the left hemidiaphragm. No pneumothorax. No pleural effusion. Musculoskeletal: There is an S-shaped scoliotic curvature of the thoracolumbar spine. There is a nondisplaced fracture seen through the anteroinferior endplate of the T6 vertebral body which extends through the mid vertebral body. No extension to the posterior elements is seen. No malalignment is noted. Superior compression deformity of the T8 vertebral bodies seen with less than 25% loss in height. No retropulsion of fragments is seen. There is diffuse osteopenia. Degenerative changes are seen through the thoracolumbar spine. Abdomen/pelvis: Hepatobiliary: Homogeneous hepatic attenuation without traumatic injury. No focal lesion. Gallbladder physiologically distended, no calcified stone. No biliary dilatation. Pancreas: No evidence for traumatic injury. Portions are partially obscured by adjacent bowel loops and paucity of intra-abdominal fat. No ductal dilatation or inflammation. Spleen: Homogeneous attenuation without traumatic injury. Normal in size. Adrenals/Urinary Tract: No adrenal hemorrhage. Kidneys demonstrate symmetric enhancement and excretion on delayed phase imaging. No evidence or renal injury. Ureters are well opacified proximal through mid portion. Bladder is physiologically distended without wall thickening. Stomach/Bowel: Suboptimally assessed without enteric contrast, allowing for this, no evidence of bowel injury. Stomach physiologically distended. There are no dilated or thickened small or large bowel loops. Moderate stool burden. No evidence of mesenteric hematoma. No free air free fluid. Vascular/Lymphatic: No acute vascular injury. The abdominal aorta and IVC are intact. No evidence of retroperitoneal, abdominal, or pelvic adenopathy. Scattered aortic  atherosclerosis is noted. For Reproductive: No acute abnormality. Other: No focal contusion or abnormality of the abdominal wall. Musculoskeletal: No acute fracture of the lumbar spine or bony pelvis. IMPRESSION: 1. Nondisplaced fracture through the anteroinferior endplate and mid body of T6. No extension to the posterior elements or malalignment. 2. Superior compression deformity of the T8 vertebral body with less than 25% loss in height. 3. No acute intrathoracic, abdominal, or pelvic injury. 4. 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  Recommend annual imaging followup by CTA. This recommendation follows 2010 ACCF/AHA/AATS/ACR/ASA/SCA/SCAI/SIR/STS/SVM Guidelines for the Diagnosis and Management of Patients with Thoracic Aortic Disease. Circulation. 2010; 121: N235-T732. Aortic aneurysm NOS (ICD10-I71.9) 5.  Aortic Atherosclerosis (ICD10-I70.0). Electronically Signed   By: Prudencio Pair M.D.   On: 08/26/2019 21:15      IMPRESSION AND PLAN:   1.  Mechanical fall with subsequent T6 and T8 vertebral fracture.  The patient will be admitted to a medical bed.  Pain management will be provided.  Neurosurgery consult will be obtained in a.m. Dr. Lacinda Axon is aware about the patient.  Physical therapy consult will be obtained.  We will also obtain a case management consult as the patient will likely require rehabilitation post discharge as his wife will not be able to take care of him.  If he fails conservative management he may need kyphoplasty.  He was placed on chest bracing in the ER  2.  BPH.  We will continue Flomax.  3.  Dementia.  We will continue galantamine.  4.  DVT prophylaxis.  Subcutaneous Lovenox    All the records are reviewed and case discussed with ED provider. The plan of care was discussed in details with the patient (and family). I answered all questions. The patient agreed to proceed with the above mentioned plan. Further management will depend upon  hospital course.   CODE STATUS: Full code  TOTAL TIME TAKING CARE OF THIS PATIENT: 45 minutes.    Christel Mormon M.D on 08/27/2019 at 3:10 AM  [facility]   From 7 PM-7 AM, contact night-coverage [email or web address]  CC: Primary care physician; Rusty Aus, MD   Note: This dictation was prepared with Dragon dictation along with smaller phrase technology. Any transcriptional errors that result from this process are unintentional.

## 2019-08-27 NOTE — Consult Note (Signed)
Reason for Consult: Multiple compression fractures thoracic spine Referring Physician: Dr. Sonia SidePatel  Austin Ramos is an 86 y.o. male.  HPI: Patient is a 86 year old who reports he has been had difficulty with balance but has been a ambulating without assistive device up until a recent fall approximately 9 days ago.  He had a second fall and has had CT that shows compression fractures in the mid thoracic spine.  He reports that he cannot lay flat and feels better sitting straight up.  He has been sleeping in her recliner but without it reclined.  He denies bowel or bladder dysfunction.  He is seen with his wife today at bedside  Past Medical History:  Diagnosis Date  . BPH (benign prostatic hyperplasia)   . Hyperlipidemia     History reviewed. No pertinent surgical history.  No family history on file.  Social History:  reports that he has never smoked. He has never used smokeless tobacco. He reports that he does not drink alcohol or use drugs.  Allergies: No Known Allergies  Medications: I have reviewed the patient's current medications.  Results for orders placed or performed during the hospital encounter of 08/26/19 (from the past 48 hour(s))  Basic metabolic panel     Status: Abnormal   Collection Time: 08/26/19  6:09 PM  Result Value Ref Range   Sodium 135 135 - 145 mmol/L   Potassium 4.7 3.5 - 5.1 mmol/L   Chloride 100 98 - 111 mmol/L   CO2 26 22 - 32 mmol/L   Glucose, Bld 114 (H) 70 - 99 mg/dL   BUN 30 (H) 8 - 23 mg/dL   Creatinine, Ser 8.650.79 0.61 - 1.24 mg/dL   Calcium 9.5 8.9 - 78.410.3 mg/dL   GFR calc non Af Amer >60 >60 mL/min   GFR calc Af Amer >60 >60 mL/min   Anion gap 9 5 - 15    Comment: Performed at [facility], [address] [place], [place] [address]  CBC     Status: None   Collection Time: 08/26/19  6:09 PM  Result Value Ref Range   WBC 8.0 4.0 - 10.5 K/uL   RBC 4.71 4.22 - 5.81 MIL/uL   Hemoglobin 14.5 13.0 - 17.0 g/dL   HCT 52.842.5 41.339.0 - 24.452.0 %    MCV 90.2 80.0 - 100.0 fL   MCH 30.8 26.0 - 34.0 pg   MCHC 34.1 30.0 - 36.0 g/dL   RDW 01.013.4 27.211.5 - 53.615.5 %   Platelets 214 150 - 400 K/uL   nRBC 0.0 0.0 - 0.2 %    Comment: Performed at [facility], [address] [place], [place] [address]  Respiratory Panel by RT PCR (Flu A&B, Covid) - Nasopharyngeal Swab     Status: None   Collection Time: 08/26/19 10:25 PM   Specimen: Nasopharyngeal Swab  Result Value Ref Range   SARS Coronavirus 2 by RT PCR NEGATIVE NEGATIVE    Comment: (NOTE) SARS-CoV-2 target nucleic acids are NOT DETECTED. The SARS-CoV-2 RNA is generally detectable in upper respiratoy specimens during the acute phase of infection. The lowest concentration of SARS-CoV-2 viral copies this assay can detect is 131 copies/mL. A negative result does not preclude SARS-Cov-2 infection and should not be used as the sole basis for treatment or other patient management decisions. A negative result may occur with  improper specimen collection/handling, submission of specimen other than nasopharyngeal swab, presence of viral mutation(s) within the areas targeted by this assay, and inadequate  number of viral copies (<131 copies/mL). A negative result must be combined with clinical observations, patient history, and epidemiological information. The expected result is Negative. Fact Sheet for Patients:  PinkCheek.be Fact Sheet for Healthcare Providers:  GravelBags.it This test is not yet ap proved or cleared by the [place] FDA and  has been authorized for detection and/or diagnosis of SARS-CoV-2 by FDA under an Emergency Use Authorization (EUA). This EUA will remain  in effect (meaning this test can be used) for the duration of the COVID-19 declaration under Section 564(b)(1) of the Act, 21 U.S.C. section 360bbb-3(b)(1), unless the authorization is terminated or revoked sooner.    Influenza A by PCR NEGATIVE  NEGATIVE   Influenza B by PCR NEGATIVE NEGATIVE    Comment: (NOTE) The Xpert Xpress SARS-CoV-2/FLU/RSV assay is intended as an aid in  the diagnosis of influenza from Nasopharyngeal swab specimens and  should not be used as a sole basis for treatment. Nasal washings and  aspirates are unacceptable for Xpert Xpress SARS-CoV-2/FLU/RSV  testing. Fact Sheet for Patients: PinkCheek.be Fact Sheet for Healthcare Providers: GravelBags.it This test is not yet approved or cleared by the [place] FDA and  has been authorized for detection and/or diagnosis of SARS-CoV-2 by  FDA under an Emergency Use Authorization (EUA). This EUA will remain  in effect (meaning this test can be used) for the duration of the  Covid-19 declaration under Section 564(b)(1) of the Act, 21  U.S.C. section 360bbb-3(b)(1), unless the authorization is  terminated or revoked. Performed at [facility], [address] [place], [place] [address]   Basic metabolic panel     Status: None   Collection Time: 08/27/19  5:49 AM  Result Value Ref Range   Sodium 136 135 - 145 mmol/L   Potassium 3.9 3.5 - 5.1 mmol/L   Chloride 102 98 - 111 mmol/L   CO2 26 22 - 32 mmol/L   Glucose, Bld 98 70 - 99 mg/dL   BUN 22 8 - 23 mg/dL   Creatinine, Ser 0.73 0.61 - 1.24 mg/dL   Calcium 8.9 8.9 - 10.3 mg/dL   GFR calc non Af Amer >60 >60 mL/min   GFR calc Af Amer >60 >60 mL/min   Anion gap 8 5 - 15    Comment: Performed at [facility], [address] [place], [place] [address]  CBC     Status: None   Collection Time: 08/27/19  5:49 AM  Result Value Ref Range   WBC 8.0 4.0 - 10.5 K/uL   RBC 4.58 4.22 - 5.81 MIL/uL   Hemoglobin 14.0 13.0 - 17.0 g/dL   HCT 41.4 39.0 - 52.0 %   MCV 90.4 80.0 - 100.0 fL   MCH 30.6 26.0 - 34.0 pg   MCHC 33.8 30.0 - 36.0 g/dL   RDW 13.3 11.5 - 15.5 %   Platelets 184 150 - 400 K/uL   nRBC 0.0 0.0 - 0.2 %    Comment:  Performed at [facility], [address] [place], [place] [address]    DG Chest 2 View  Result Date: 08/26/2019 CLINICAL DATA:  Chest pain, fall EXAM: CHEST - 2 VIEW COMPARISON:  None. FINDINGS: The heart size and mediastinal contours are within normal limits. Aortic knob calcifications. Elevation of the left hemidiaphragm with gastric bubble and air-filled loops of bowel. Degenerative changes in the midthoracic spine with anterior flowing osteophytes. IMPRESSION: No active cardiopulmonary disease. Electronically Signed   By: Ebony Cargo.D.  On: 08/26/2019 18:45   CT HEAD WO CONTRAST  Result Date: 08/26/2019 CLINICAL DATA:  Head trauma, headache EXAM: CT HEAD WITHOUT CONTRAST TECHNIQUE: Contiguous axial images were obtained from the base of the skull through the vertex without intravenous contrast. COMPARISON:  None. FINDINGS: Brain: No evidence of acute territorial infarction, hemorrhage, hydrocephalus,extra-axial collection or mass lesion/mass effect. There is dilatation the ventricles and sulci consistent with age-related atrophy. Low-attenuation changes in the deep white matter consistent with small vessel ischemia. Vascular: No hyperdense vessel or unexpected calcification. Skull: The skull is intact. No fracture or focal lesion identified. Sinuses/Orbits: The visualized paranasal sinuses and mastoid air cells are clear. The orbits and globes intact. Other: None IMPRESSION: No acute intracranial abnormality. Findings consistent with age related atrophy and chronic small vessel ischemia Electronically Signed   By: Jonna Clark M.D.   On: 08/26/2019 18:39   CT Chest W Contrast  Result Date: 08/26/2019 CLINICAL DATA:  Abdominal trauma, fall 1 week ago, continued pain EXAM: CT CHEST WITH CONTRAST TECHNIQUE: Multidetector CT imaging of the chest was performed during intravenous contrast administration. CONTRAST:  OMNIPAQUE IOHEXOL 300 MG/ML  SOLN COMPARISON:  None. FINDINGS:  Cardiovascular: Normal heart size. No significant pericardial fluid/thickening. Great vessels are normal in course and caliber. No evidence of acute thoracic aortic injury. No central pulmonary emboli. There is mild aneurysmal dilatation of the ascending intrathoracic aorta measuring 4 cm in maximum transverse dimension which tapers at the level of the aortic arch. Scattered aortic atherosclerosis is seen. Coronary artery calcifications are noted. Mediastinum/Nodes: No pneumomediastinum. No mediastinal hematoma. Unremarkable esophagus. No axillary, mediastinal or hilar lymphadenopathy. Lungs/Pleura:Minimal atelectasis or scarring is seen at the left lung base. There is elevation of the left hemidiaphragm. No pneumothorax. No pleural effusion. Musculoskeletal: There is an S-shaped scoliotic curvature of the thoracolumbar spine. There is a nondisplaced fracture seen through the anteroinferior endplate of the T6 vertebral body which extends through the mid vertebral body. No extension to the posterior elements is seen. No malalignment is noted. Superior compression deformity of the T8 vertebral bodies seen with less than 25% loss in height. No retropulsion of fragments is seen. There is diffuse osteopenia. Degenerative changes are seen through the thoracolumbar spine. Abdomen/pelvis: Hepatobiliary: Homogeneous hepatic attenuation without traumatic injury. No focal lesion. Gallbladder physiologically distended, no calcified stone. No biliary dilatation. Pancreas: No evidence for traumatic injury. Portions are partially obscured by adjacent bowel loops and paucity of intra-abdominal fat. No ductal dilatation or inflammation. Spleen: Homogeneous attenuation without traumatic injury. Normal in size. Adrenals/Urinary Tract: No adrenal hemorrhage. Kidneys demonstrate symmetric enhancement and excretion on delayed phase imaging. No evidence or renal injury. Ureters are well opacified proximal through mid portion. Bladder is  physiologically distended without wall thickening. Stomach/Bowel: Suboptimally assessed without enteric contrast, allowing for this, no evidence of bowel injury. Stomach physiologically distended. There are no dilated or thickened small or large bowel loops. Moderate stool burden. No evidence of mesenteric hematoma. No free air free fluid. Vascular/Lymphatic: No acute vascular injury. The abdominal aorta and IVC are intact. No evidence of retroperitoneal, abdominal, or pelvic adenopathy. Scattered aortic atherosclerosis is noted. For Reproductive: No acute abnormality. Other: No focal contusion or abnormality of the abdominal wall. Musculoskeletal: No acute fracture of the lumbar spine or bony pelvis. IMPRESSION: 1. Nondisplaced fracture through the anteroinferior endplate and mid body of T6. No extension to the posterior elements or malalignment. 2. Superior compression deformity of the T8 vertebral body with less than 25% loss in height. 3.  No acute intrathoracic, abdominal, or pelvic injury. 4. 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 Recommend annual imaging followup by CTA. This recommendation follows 2010 ACCF/AHA/AATS/ACR/ASA/SCA/SCAI/SIR/STS/SVM Guidelines for the Diagnosis and Management of Patients with Thoracic Aortic Disease. Circulation. 2010; 121: N829-F621. Aortic aneurysm NOS (ICD10-I71.9) 5.  Aortic Atherosclerosis (ICD10-I70.0). Electronically Signed   By: Jonna Clark M.D.   On: 08/26/2019 21:15   CT Cervical Spine Wo Contrast  Result Date: 08/26/2019 CLINICAL DATA:  Fall 1 week ago with persistent neck pain, initial encounter EXAM: CT CERVICAL SPINE WITHOUT CONTRAST TECHNIQUE: Multidetector CT imaging of the cervical spine was performed without intravenous contrast. Multiplanar CT image reconstructions were also generated. COMPARISON:  None. FINDINGS: Alignment: Within normal limits. Skull base and vertebrae: 7 cervical segments are well visualized.  Vertebral body height is well maintained. Multilevel facet hypertrophic changes are seen as well as osteophytic changes. No acute fracture or acute facet abnormality is noted. Disc space narrowing is seen throughout the cervical spine. Soft tissues and spinal canal: Vascular calcifications are seen. No focal hematoma is noted. No other soft tissue abnormality is noted. Upper chest: Visualized lung apices are within normal limits. Other: None IMPRESSION: Multilevel degenerative change without acute abnormality. Electronically Signed   By: Alcide Clever M.D.   On: 08/26/2019 21:09   CT ABDOMEN PELVIS W CONTRAST  Result Date: 08/26/2019 CLINICAL DATA:  Abdominal trauma, fall 1 week ago, continued pain EXAM: CT CHEST WITH CONTRAST TECHNIQUE: Multidetector CT imaging of the chest was performed during intravenous contrast administration. CONTRAST:  OMNIPAQUE IOHEXOL 300 MG/ML  SOLN COMPARISON:  None. FINDINGS: Cardiovascular: Normal heart size. No significant pericardial fluid/thickening. Great vessels are normal in course and caliber. No evidence of acute thoracic aortic injury. No central pulmonary emboli. There is mild aneurysmal dilatation of the ascending intrathoracic aorta measuring 4 cm in maximum transverse dimension which tapers at the level of the aortic arch. Scattered aortic atherosclerosis is seen. Coronary artery calcifications are noted. Mediastinum/Nodes: No pneumomediastinum. No mediastinal hematoma. Unremarkable esophagus. No axillary, mediastinal or hilar lymphadenopathy. Lungs/Pleura:Minimal atelectasis or scarring is seen at the left lung base. There is elevation of the left hemidiaphragm. No pneumothorax. No pleural effusion. Musculoskeletal: There is an S-shaped scoliotic curvature of the thoracolumbar spine. There is a nondisplaced fracture seen through the anteroinferior endplate of the T6 vertebral body which extends through the mid vertebral body. No extension to the posterior elements  is seen. No malalignment is noted. Superior compression deformity of the T8 vertebral bodies seen with less than 25% loss in height. No retropulsion of fragments is seen. There is diffuse osteopenia. Degenerative changes are seen through the thoracolumbar spine. Abdomen/pelvis: Hepatobiliary: Homogeneous hepatic attenuation without traumatic injury. No focal lesion. Gallbladder physiologically distended, no calcified stone. No biliary dilatation. Pancreas: No evidence for traumatic injury. Portions are partially obscured by adjacent bowel loops and paucity of intra-abdominal fat. No ductal dilatation or inflammation. Spleen: Homogeneous attenuation without traumatic injury. Normal in size. Adrenals/Urinary Tract: No adrenal hemorrhage. Kidneys demonstrate symmetric enhancement and excretion on delayed phase imaging. No evidence or renal injury. Ureters are well opacified proximal through mid portion. Bladder is physiologically distended without wall thickening. Stomach/Bowel: Suboptimally assessed without enteric contrast, allowing for this, no evidence of bowel injury. Stomach physiologically distended. There are no dilated or thickened small or large bowel loops. Moderate stool burden. No evidence of mesenteric hematoma. No free air free fluid. Vascular/Lymphatic: No acute vascular injury. The abdominal aorta and IVC are intact.  No evidence of retroperitoneal, abdominal, or pelvic adenopathy. Scattered aortic atherosclerosis is noted. For Reproductive: No acute abnormality. Other: No focal contusion or abnormality of the abdominal wall. Musculoskeletal: No acute fracture of the lumbar spine or bony pelvis. IMPRESSION: 1. Nondisplaced fracture through the anteroinferior endplate and mid body of T6. No extension to the posterior elements or malalignment. 2. Superior compression deformity of the T8 vertebral body with less than 25% loss in height. 3. No acute intrathoracic, abdominal, or pelvic injury. 4. Mild  aneurysmal dilatation of the ascending intrathoracic aorta measuring 4.0 cm. Recommend annual imaging followup by CTA. This recommendation follows 2010 ACCF/AHA/AATS/ACR/ASA/SCA/SCAI/SIR/STS/SVM Guidelines for the Diagnosis and Management of Patients with Thoracic Aortic Disease. Circulation. 2010; 121: T017-B939. Aortic aneurysm NOS (ICD10-I71.9) 5.  Aortic Atherosclerosis (ICD10-I70.0). Electronically Signed   By: Jonna Clark M.D.   On: 08/26/2019 21:15    Review of Systems Blood pressure (!) 146/59, pulse 64, temperature (!) 97.3 F (36.3 C), temperature source Oral, resp. rate 16, height 5\' 10"  (1.778 m), weight 52.2 kg, SpO2 97 %. Physical Exam he has tenderness to palpation along the mid thoracic spine without significant kyphotic deformity.  He is wearing the back brace.  He does not have a clonus. Review of CT shows superior compression fracture at T8 and mild at T6 age-indeterminate.  Assessment/Plan: Probable T8 acute fracture.  Recommend trying the brace and physical therapy.  If it does not give relief I think you need an MRI but with him having so much difficulty laying flat I think we have to give him some sedation.  His wife has had kyphoplasty recently and is worried about having him put to sleep by a reassured her we could do this under local with sedation.  At age 39 if possible we like to treat him nonoperatively I will continue to follow him here and have talked to physical therapy about getting him mobilized.  82 08/27/2019, 2:06 PM

## 2019-08-27 NOTE — Progress Notes (Signed)
[facility]  - [place] [facility]   PATIENT NAME: Austin Ramos    MR#:  [id number]  DATE OF BIRTH:  1925/11/04  SUBJECTIVE:  patient came in after he had mechanical fall and scalp laceration status post stapling along with back pain.  Wife Myriam Jacobson in the room. Patient feels better after brace was placed. He is eating lunch.  REVIEW OF SYSTEMS:   Review of Systems  Constitutional: Negative for chills, fever and weight loss.  HENT: Negative for ear discharge, ear pain and nosebleeds.   Eyes: Negative for blurred vision, pain and discharge.  Respiratory: Negative for sputum production, shortness of breath, wheezing and stridor.   Cardiovascular: Negative for chest pain, palpitations, orthopnea and PND.  Gastrointestinal: Negative for abdominal pain, diarrhea, nausea and vomiting.  Genitourinary: Negative for frequency and urgency.  Musculoskeletal: Positive for back pain, falls and joint pain.  Neurological: Positive for weakness. Negative for sensory change, speech change and focal weakness.  Psychiatric/Behavioral: Negative for depression and hallucinations. The patient is not nervous/anxious.    Tolerating Diet:yes  Tolerating PT: pending  DRUG ALLERGIES:  No Known Allergies  VITALS:  Blood pressure (!) 146/59, pulse 64, temperature (!) 97.3 F (36.3 C), temperature source Oral, resp. rate 16, height 5\' 10"  (1.778 m), weight 52.2 kg, SpO2 97 %.  PHYSICAL EXAMINATION:   Physical Exam  GENERAL:  86 y.o.-year-old patient lying in the bed with no acute distress.  EYES: Pupils equal, round, reactive to light and accommodation. No scleral icterus.   HEENT:  staples + in occipital area, normocephalic. Oropharynx and nasopharynx clear.  NECK:  Supple, no jugular venous distention. No thyroid enlargement, no tenderness.  LUNGS: Normal breath sounds bilaterally, no wheezing, rales, rhonchi. No use of accessory muscles of respiration. Thoracic  Brace+ CARDIOVASCULAR: S1, S2 normal. No murmurs, rubs, or gallops.  ABDOMEN: Soft, nontender, nondistended. Bowel sounds present. No organomegaly or mass.  EXTREMITIES: No cyanosis, clubbing or edema b/l.    NEUROLOGIC:  No focal Motor or sensory deficits b/l.  Grossly intact PSYCHIATRIC:  patient is alert and oriented x 3.  SKIN: No obvious rash, lesion, or ulcer.   LABORATORY PANEL:  CBC Recent Labs  Lab 08/27/19 0549  WBC 8.0  HGB 14.0  HCT 41.4  PLT 184    Chemistries  Recent Labs  Lab 08/27/19 0549  NA 136  K 3.9  CL 102  CO2 26  GLUCOSE 98  BUN 22  CREATININE 0.73  CALCIUM 8.9   Cardiac Enzymes No results for input(s): TROPONINI in the last 168 hours. RADIOLOGY:  DG Chest 2 View  Result Date: 08/26/2019 CLINICAL DATA:  Chest pain, fall EXAM: CHEST - 2 VIEW COMPARISON:  None. FINDINGS: The heart size and mediastinal contours are within normal limits. Aortic knob calcifications. Elevation of the left hemidiaphragm with gastric bubble and air-filled loops of bowel. Degenerative changes in the midthoracic spine with anterior flowing osteophytes. IMPRESSION: No active cardiopulmonary disease. Electronically Signed   By: 10/24/2019 M.D.   On: 08/26/2019 18:45   CT HEAD WO CONTRAST  Result Date: 08/26/2019 CLINICAL DATA:  Head trauma, headache EXAM: CT HEAD WITHOUT CONTRAST TECHNIQUE: Contiguous axial images were obtained from the base of the skull through the vertex without intravenous contrast. COMPARISON:  None. FINDINGS: Brain: No evidence of acute territorial infarction, hemorrhage, hydrocephalus,extra-axial collection or mass lesion/mass effect. There is dilatation the ventricles and sulci consistent with age-related atrophy. Low-attenuation changes in the deep white matter consistent with small  vessel ischemia. Vascular: No hyperdense vessel or unexpected calcification. Skull: The skull is intact. No fracture or focal lesion identified. Sinuses/Orbits: The visualized  paranasal sinuses and mastoid air cells are clear. The orbits and globes intact. Other: None IMPRESSION: No acute intracranial abnormality. Findings consistent with age related atrophy and chronic small vessel ischemia Electronically Signed   By: Jonna Clark M.D.   On: 08/26/2019 18:39   CT Chest W Contrast  Result Date: 08/26/2019 CLINICAL DATA:  Abdominal trauma, fall 1 week ago, continued pain EXAM: CT CHEST WITH CONTRAST TECHNIQUE: Multidetector CT imaging of the chest was performed during intravenous contrast administration. CONTRAST:  OMNIPAQUE IOHEXOL 300 MG/ML  SOLN COMPARISON:  None. FINDINGS: Cardiovascular: Normal heart size. No significant pericardial fluid/thickening. Great vessels are normal in course and caliber. No evidence of acute thoracic aortic injury. No central pulmonary emboli. There is mild aneurysmal dilatation of the ascending intrathoracic aorta measuring 4 cm in maximum transverse dimension which tapers at the level of the aortic arch. Scattered aortic atherosclerosis is seen. Coronary artery calcifications are noted. Mediastinum/Nodes: No pneumomediastinum. No mediastinal hematoma. Unremarkable esophagus. No axillary, mediastinal or hilar lymphadenopathy. Lungs/Pleura:Minimal atelectasis or scarring is seen at the left lung base. There is elevation of the left hemidiaphragm. No pneumothorax. No pleural effusion. Musculoskeletal: There is an S-shaped scoliotic curvature of the thoracolumbar spine. There is a nondisplaced fracture seen through the anteroinferior endplate of the T6 vertebral body which extends through the mid vertebral body. No extension to the posterior elements is seen. No malalignment is noted. Superior compression deformity of the T8 vertebral bodies seen with less than 25% loss in height. No retropulsion of fragments is seen. There is diffuse osteopenia. Degenerative changes are seen through the thoracolumbar spine. Abdomen/pelvis: Hepatobiliary: Homogeneous  hepatic attenuation without traumatic injury. No focal lesion. Gallbladder physiologically distended, no calcified stone. No biliary dilatation. Pancreas: No evidence for traumatic injury. Portions are partially obscured by adjacent bowel loops and paucity of intra-abdominal fat. No ductal dilatation or inflammation. Spleen: Homogeneous attenuation without traumatic injury. Normal in size. Adrenals/Urinary Tract: No adrenal hemorrhage. Kidneys demonstrate symmetric enhancement and excretion on delayed phase imaging. No evidence or renal injury. Ureters are well opacified proximal through mid portion. Bladder is physiologically distended without wall thickening. Stomach/Bowel: Suboptimally assessed without enteric contrast, allowing for this, no evidence of bowel injury. Stomach physiologically distended. There are no dilated or thickened small or large bowel loops. Moderate stool burden. No evidence of mesenteric hematoma. No free air free fluid. Vascular/Lymphatic: No acute vascular injury. The abdominal aorta and IVC are intact. No evidence of retroperitoneal, abdominal, or pelvic adenopathy. Scattered aortic atherosclerosis is noted. For Reproductive: No acute abnormality. Other: No focal contusion or abnormality of the abdominal wall. Musculoskeletal: No acute fracture of the lumbar spine or bony pelvis. IMPRESSION: 1. Nondisplaced fracture through the anteroinferior endplate and mid body of T6. No extension to the posterior elements or malalignment. 2. Superior compression deformity of the T8 vertebral body with less than 25% loss in height. 3. No acute intrathoracic, abdominal, or pelvic injury. 4. 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 Recommend annual imaging followup by CTA. This recommendation follows 2010 ACCF/AHA/AATS/ACR/ASA/SCA/SCAI/SIR/STS/SVM Guidelines for the Diagnosis and Management of Patients with Thoracic Aortic Disease. Circulation. 2010; 121:  Z610-R604. Aortic aneurysm NOS (ICD10-I71.9) 5.  Aortic Atherosclerosis (ICD10-I70.0). Electronically Signed   By: Jonna Clark M.D.   On: 08/26/2019 21:15   CT Cervical Spine Wo Contrast  Result Date: 08/26/2019  CLINICAL DATA:  Fall 1 week ago with persistent neck pain, initial encounter EXAM: CT CERVICAL SPINE WITHOUT CONTRAST TECHNIQUE: Multidetector CT imaging of the cervical spine was performed without intravenous contrast. Multiplanar CT image reconstructions were also generated. COMPARISON:  None. FINDINGS: Alignment: Within normal limits. Skull base and vertebrae: 7 cervical segments are well visualized. Vertebral body height is well maintained. Multilevel facet hypertrophic changes are seen as well as osteophytic changes. No acute fracture or acute facet abnormality is noted. Disc space narrowing is seen throughout the cervical spine. Soft tissues and spinal canal: Vascular calcifications are seen. No focal hematoma is noted. No other soft tissue abnormality is noted. Upper chest: Visualized lung apices are within normal limits. Other: None IMPRESSION: Multilevel degenerative change without acute abnormality. Electronically Signed   By: Alcide Clever M.D.   On: 08/26/2019 21:09   CT ABDOMEN PELVIS W CONTRAST  Result Date: 08/26/2019 CLINICAL DATA:  Abdominal trauma, fall 1 week ago, continued pain EXAM: CT CHEST WITH CONTRAST TECHNIQUE: Multidetector CT imaging of the chest was performed during intravenous contrast administration. CONTRAST:  OMNIPAQUE IOHEXOL 300 MG/ML  SOLN COMPARISON:  None. FINDINGS: Cardiovascular: Normal heart size. No significant pericardial fluid/thickening. Great vessels are normal in course and caliber. No evidence of acute thoracic aortic injury. No central pulmonary emboli. There is mild aneurysmal dilatation of the ascending intrathoracic aorta measuring 4 cm in maximum transverse dimension which tapers at the level of the aortic arch. Scattered aortic atherosclerosis  is seen. Coronary artery calcifications are noted. Mediastinum/Nodes: No pneumomediastinum. No mediastinal hematoma. Unremarkable esophagus. No axillary, mediastinal or hilar lymphadenopathy. Lungs/Pleura:Minimal atelectasis or scarring is seen at the left lung base. There is elevation of the left hemidiaphragm. No pneumothorax. No pleural effusion. Musculoskeletal: There is an S-shaped scoliotic curvature of the thoracolumbar spine. There is a nondisplaced fracture seen through the anteroinferior endplate of the T6 vertebral body which extends through the mid vertebral body. No extension to the posterior elements is seen. No malalignment is noted. Superior compression deformity of the T8 vertebral bodies seen with less than 25% loss in height. No retropulsion of fragments is seen. There is diffuse osteopenia. Degenerative changes are seen through the thoracolumbar spine. Abdomen/pelvis: Hepatobiliary: Homogeneous hepatic attenuation without traumatic injury. No focal lesion. Gallbladder physiologically distended, no calcified stone. No biliary dilatation. Pancreas: No evidence for traumatic injury. Portions are partially obscured by adjacent bowel loops and paucity of intra-abdominal fat. No ductal dilatation or inflammation. Spleen: Homogeneous attenuation without traumatic injury. Normal in size. Adrenals/Urinary Tract: No adrenal hemorrhage. Kidneys demonstrate symmetric enhancement and excretion on delayed phase imaging. No evidence or renal injury. Ureters are well opacified proximal through mid portion. Bladder is physiologically distended without wall thickening. Stomach/Bowel: Suboptimally assessed without enteric contrast, allowing for this, no evidence of bowel injury. Stomach physiologically distended. There are no dilated or thickened small or large bowel loops. Moderate stool burden. No evidence of mesenteric hematoma. No free air free fluid. Vascular/Lymphatic: No acute vascular injury. The abdominal  aorta and IVC are intact. No evidence of retroperitoneal, abdominal, or pelvic adenopathy. Scattered aortic atherosclerosis is noted. For Reproductive: No acute abnormality. Other: No focal contusion or abnormality of the abdominal wall. Musculoskeletal: No acute fracture of the lumbar spine or bony pelvis. IMPRESSION: 1. Nondisplaced fracture through the anteroinferior endplate and mid body of T6. No extension to the posterior elements or malalignment. 2. Superior compression deformity of the T8 vertebral body with less than 25% loss in height. 3. No acute intrathoracic,  abdominal, or pelvic injury. 4. 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 Recommend annual imaging followup by CTA. This recommendation follows 2010 ACCF/AHA/AATS/ACR/ASA/SCA/SCAI/SIR/STS/SVM Guidelines for the Diagnosis and Management of Patients with Thoracic Aortic Disease. Circulation. 2010; 121: S177-L390. Aortic aneurysm NOS (ICD10-I71.9) 5.  Aortic Atherosclerosis (ICD10-I70.0). Electronically Signed   By: Prudencio Pair M.D.   On: 08/26/2019 21:15   ASSESSMENT AND PLAN:  Thurmon Mizell  is a 86 y.o. male with a known history of dyslipidemia and BPH, presented to the emergency room with acute onset of fall while he was at his primary care physician's office after coming out of the bathroom and turning them losing his balance and falling hitting his forehead.   1.  Mechanical fall with subsequent T6 endplate and T8 vertebral compression fracture (25% height loss) -   Pain management will be provided.   -  Physical therapy consult  -orthopedic consultation with Dr. Rudene Christians.-- Await further recommendations  2.  BPH.   - will continue Flomax.  3.HL -takes herbal meds  4.  DVT prophylaxis.  Subcutaneous Lovenox (holding in case patient goes for Kyphoplasty)   Procedures: Family communication :pt and wife Architect Consults :ortho, PT Discharge Disposition :TBD CODE STATUS: FULL DVT  Prophylaxis :lovenox (on hold) Barriers to discharge: PT eval and ortho consult  TOTAL TIME TAKING CARE OF THIS PATIENT: *25* minutes.  >50% time spent on counselling and coordination of care  Note: This dictation was prepared with Dragon dictation along with smaller phrase technology. Any transcriptional errors that result from this process are unintentional.  Fritzi Mandes M.D    [facility]   CC: Primary care physician; Rusty Aus, MDPatient ID: Austin Ramos, male   DOB: 1925-12-30, 86 y.o.   MRN: [id number]

## 2019-08-27 NOTE — Progress Notes (Signed)
Initial Nutrition Assessment  DOCUMENTATION CODES:   Underweight  INTERVENTION:   Ensure Enlive po BID, each supplement provides 350 kcal and 20 grams of protein  Magic cup TID with meals, each supplement provides 290 kcal and 9 grams of protein  Dysphagia 3 diet   NUTRITION DIAGNOSIS:   Predicted suboptimal nutrient intake related to social / environmental circumstances(dementia, advanced age) as evidenced by other (comment)(pt is underweight)  GOAL:   Patient will meet greater than or equal to 90% of their needs  MONITOR:   PO intake, Supplement acceptance, Labs, Weight trends, Skin, I & O's  REASON FOR ASSESSMENT:   Malnutrition Screening Tool    ASSESSMENT:   86 y.o. male with a known history of dyslipidemia, dementia and BPH, presented to the emergency room with acute onset of fall and found to have T6 and T8 compression fractures  RD working remotely.  Unable to speak with pt r/t dementia. Suspect pt with poor appetite and oral intake at baseline as pt is underweight. Per MD note, pt ate lunch today. RD will add supplements and liberalize pt's diet to help him meet his estimated needs. Per chart, pt weighed 152lbs in 03/2019, 150lbs in 04/2019, and 145lbs on 08/19/2019. RD unsure if admit weight is correct or if pt has had any significant weight loss.   Pt is at high risk for malnutrition but unable to diagnose at this time as NFPE cannot be performed.    Medications reviewed and include: vitamin C, D3, folic acid   Labs reviewed:   Unable to complete Nutrition-Focused physical exam at this time.   Diet Order:   Diet Order            DIET DYS 3 Room service appropriate? No; Fluid consistency: Thin  Diet effective now             EDUCATION NEEDS:   Not appropriate for education at this time  Skin:  Skin Assessment: Reviewed RN Assessment(head laceration)  Last BM:  2/1  Height:   Ht Readings from Last 1 Encounters:  08/26/19 5\' 10"  (1.778 m)     Weight:   Wt Readings from Last 1 Encounters:  08/26/19 52.2 kg    Ideal Body Weight:  75.45 kg  BMI:  Body mass index is 16.5 kg/m.  Estimated Nutritional Needs:   Kcal:  1600-1800kcal/day  Protein:  80-90g/day  Fluid:  >1.3L/day  10/24/19 MS, RD, LDN Pager #- [phone] Office#- [phone] After Hours Pager: [phone]

## 2019-08-27 NOTE — Evaluation (Signed)
Physical Therapy Evaluation Patient Details Name: Austin Ramos MRN: [id number] DOB: [birth year] Today's Date: 08/27/2019   History of Present Illness  Austin Ramos is a 89yoM who comes to [facility] on 08/26/19 after 2 recent falls and severe back/chest pain. Pt found to have fractures of thoracic vertebrae six and eight. Neurosurgical consult recommending TLSO/hyperextension brace and conservative management. Orthopedics consulted regarding appropriateness for kyphoplasty, recomming conservative management at this time if pt is able to be ambulatory. PMH: dyslipidemia and BPH. Pt reports chronic but progressively more frequent BLE cramp/spasm phenomenon in calves over the past couple months, etiology unclear at this time, but suspect contributory to recent falls. Previously pt was AMB without assitive device at home. Pt was formerly a pole vaulter.  Clinical Impression  Pt admitted with above diagnosis. Pt currently with functional limitations due to the deficits listed below (see "PT Problem List"). Upon entry, pt in bed, awake and agreeable to participate. The pt is alert and oriented x4, pleasant, conversational, and generally a good historian but HOH. ModA required to EOB where pt has off/on calf cramping that precludes attempting transfer for several minutes. Pt requires MaxA to rise to standing, but able to remain up for 4 minutes while author assists with pericare s/p BM. Pt able to slowly, cautiously AMB in room a short distance, but is fatigued and limited by pain to some degree. Functional mobility assessment demonstrates increased effort/time requirements, poor tolerance, and need for physical assistance, whereas the patient performed these at a higher level of independence PTA. Pt would do well to go to STR to restore PLOF in mobility and balance prior to return to home. Pt will benefit from skilled PT intervention to increase independence and safety with basic mobility in preparation for discharge to  the venue listed below.       Follow Up Recommendations Supervision for mobility/OOB;Supervision - Intermittent;SNF    Equipment Recommendations  Rolling walker with 5" wheels;None recommended by PT    Recommendations for Other Services       Precautions / Restrictions Precautions Precautions: Fall;Back Precaution Booklet Issued: No Required Braces or Orthoses: Spinal Brace Spinal Brace: Thoracolumbosacral orthotic(hyperextension bar) Restrictions Weight Bearing Restrictions: No      Mobility  Bed Mobility Overal bed mobility: Needs Assistance Bed Mobility: Supine to Sit;Sit to Supine     Supine to sit: Mod assist     General bed mobility comments: difficulty with terminal trunk flexion and pelvic scoot to EOB  Transfers Overall transfer level: Needs assistance Equipment used: Rolling walker (2 wheeled) Transfers: Sit to/from Stand Sit to Stand: Max assist         General transfer comment: able to perform with minA from elevated surface; pt having significant worsening of BLE calf cramping phenomenon at EOB which is arresting efforts.  Ambulation/Gait   Gait Distance (Feet): 28 Feet Assistive device: Rolling walker (2 wheeled) Gait Pattern/deviations: Step-to pattern     General Gait Details: slow but steady 3-point gait pattern with RW, heavy UE support. Pt able to stand for an extended period of 3-4 minutes as author assists with pericare.  Stairs            Wheelchair Mobility    Modified Rankin (Stroke Patients Only)       Balance Overall balance assessment: History of Falls;Needs assistance Sitting-balance support: Single extremity supported;Feet supported Sitting balance-Leahy Scale: Good     Standing balance support: Bilateral upper extremity supported;During functional activity Standing balance-Leahy Scale: Poor  Pertinent Vitals/Pain Pain Assessment: 0-10 Pain Score: 4  Pain Location:  mid back Pain Descriptors / Indicators: Aching Pain Intervention(s): Limited activity within patient's tolerance;Monitored during session;Premedicated before session;Repositioned    Home Living Family/patient expects to be discharged to:: Private residence Living Arrangements: Spouse/significant other Available Help at Discharge: Family Type of Home: Apartment Home Access: Level entry     Home Layout: One level Home Equipment: None      Prior Function Level of Independence: Independent         Comments: 2 recent falls, 4-6x total since MA without insult; has trouble with buttoning shirts d/t dorsal wasting.     Hand Dominance        Extremity/Trunk Assessment   Upper Extremity Assessment Upper Extremity Assessment: Generalized weakness    Lower Extremity Assessment Lower Extremity Assessment: Generalized weakness;Overall [facility] for tasks assessed    Cervical / Trunk Assessment Cervical / Trunk Assessment: Kyphotic  Communication      Cognition Arousal/Alertness: Awake/alert Behavior During Therapy: WFL for tasks assessed/performed Overall Cognitive Status: Within Functional Limits for tasks assessed                                        General Comments      Exercises     Assessment/Plan    PT Assessment Patient needs continued PT services  PT Problem List Decreased strength;Decreased activity tolerance;Decreased balance;Decreased range of motion;Decreased mobility;Decreased knowledge of precautions       PT Treatment Interventions DME instruction;Balance training;Gait training;Functional mobility training;Therapeutic activities;Therapeutic exercise;Patient/family education    PT Goals (Current goals can be found in the Care Plan section)  Acute Rehab PT Goals Patient Stated Goal: regain independence with mobility, reduce pain PT Goal Formulation: With patient Time For Goal Achievement: 09/10/19 Potential to Achieve Goals: Good     Frequency 7X/week   Barriers to discharge Decreased caregiver support wife unable to provide adequate physical assists at home.    Co-evaluation               AM-PAC PT "6 Clicks" Mobility  Outcome Measure Help needed turning from your back to your side while in a flat bed without using bedrails?: A Little Help needed moving from lying on your back to sitting on the side of a flat bed without using bedrails?: A Lot Help needed moving to and from a bed to a chair (including a wheelchair)?: Total Help needed standing up from a chair using your arms (e.g., wheelchair or bedside chair)?: Total Help needed to walk in hospital room?: A Little Help needed climbing 3-5 steps with a railing? : A Lot 6 Click Score: 12    End of Session Equipment Utilized During Treatment: Back brace Activity Tolerance: Patient tolerated treatment well Patient left: with nursing/sitter in room;with call bell/phone within reach;with family/visitor present;Other (comment)(wife present) Nurse Communication: Mobility status;Precautions PT Visit Diagnosis: Unsteadiness on feet (R26.81);Difficulty in walking, not elsewhere classified (R26.2);Other abnormalities of gait and mobility (R26.89);Muscle weakness (generalized) (M62.81);History of falling (Z91.81);Other symptoms and signs involving the nervous system (R29.898)    Time: 4944-9675 PT Time Calculation (min) (ACUTE ONLY): 36 min   Charges:   PT Evaluation $PT Eval Moderate Complexity: 1 Mod PT Treatments $Therapeutic Exercise: 8-22 mins        2:57 PM, 08/27/19 Etta Grandchild, PT, DPT Physical Therapist - [facility]  [phone] [facility])  Diedra Sinor C 08/27/2019, 2:52 PM

## 2019-08-28 ENCOUNTER — Inpatient Hospital Stay: Payer: Medicare Other

## 2019-08-28 NOTE — Progress Notes (Signed)
[facility] Progress Note  Patient: Austin Ramos    PPJ:[id number]  DOA: 08/26/2019     Date of Service: the patient was seen and examined on 08/28/2019  Chief Complaint  Patient presents with  . Fall   Brief hospital course:  Austin Ramos a89 y.o.malewith a known history of dyslipidemia and BPH, presented to the emergency room with acute onset of fall while he was at his primary care physician's office after coming out of the bathroom and turning them losing his balance and falling hitting his forehead.   Currently further plan is to get MRI thoracic spine and then awaiting for orthopedic surgeon to decide for kyphoplasty, patient's wife also has questions regarding the procedure.  Assessment and Plan:  # Mechanical fall with subsequent T6 endplate and T8 vertebral compression fracture (25% height loss) -Pain management will be provided.  - Physical therapy consult  -orthopedic consultation with Dr. Rudene Christians.-- Await further recommendations -Follow MRI T-spine  # BPH.  - will continue Flomax.  # HL -takes herbal meds, and propranolol   Body mass index is 16.5 kg/m.  Nutrition Problem: Predicted suboptimal nutrient intake Etiology: social / environmental circumstances(dementia, advanced age) Interventions:    Diet:  DVT Prophylaxis: Subcutaneous Lovenox held patient might need kyphoplasty  Advance goals of care discussion: Full code  Family Communication: family was not present at bedside, at the time of interview.  Discussed with wife over the phone she was in the room, the pt provided permission to discuss medical plan with the family. Opportunity was given to ask question and all questions were answered satisfactorily.   Disposition:  Pt is from Home, admitted with fall and thoracic spine fracture, still has pain, MRI pending, which precludes a safe discharge. Discharge to SNF, when cleared by orthopedics.  Subjective: Patient was seen and  examined at the bedside, still has pain in the back 2/10 and 8 because higher 6/10 on the movement.  Denied any chest pain or palpitations, no shortness of breath.  No abdominal pain no nausea vomiting or diarrhea.   Physical Exam: General:  alert oriented to time, place, and person.  Appear in mild distress, affect appropriate Eyes: PERRL ENT: Oral Mucosa Clear, moist  Neck: no JVD,  Cardiovascular: S1 and S2 Present, no audible murmur,  Respiratory: good respiratory effort, Bilateral Air entry equal and Decreased, no crackles, no wheezes Abdomen: Bowel Sound +, Soft and non tenderness,  Skin: no rashes Extremities: no Pedal edema, no calf tenderness Neurologic: without any new focal findings Gait not checked due to patient safety concerns  Vitals:   08/27/19 1506 08/27/19 2044 08/28/19 0425 08/28/19 0953  BP: (!) 113/48 (!) 114/53 (!) 158/58 (!) 124/56  Pulse: 67 75 74 78  Resp:  18 18 18   Temp:  97.7 F (36.5 C) 99.1 F (37.3 C)   TempSrc:  Oral Oral   SpO2:  99% 98% 97%  Weight:      Height:        Intake/Output Summary (Last 24 hours) at 08/28/2019 1230 Last data filed at 08/28/2019 0945 Gross per 24 hour  Intake 680 ml  Output 700 ml  Net -20 ml   Filed Weights   08/26/19 1803  Weight: 52.2 kg    Data Reviewed: I have personally reviewed and interpreted daily labs, tele strips, imagings as discussed above. I reviewed all nursing notes, pharmacy notes, vitals, pertinent old records I have discussed plan of care as described above with RN and patient/family.  CBC:  Recent Labs  Lab 08/26/19 1809 08/27/19 0549  WBC 8.0 8.0  HGB 14.5 14.0  HCT 42.5 41.4  MCV 90.2 90.4  PLT 214 184   Basic Metabolic Panel: Recent Labs  Lab 08/26/19 1809 08/27/19 0549  NA 135 136  K 4.7 3.9  CL 100 102  CO2 26 26  GLUCOSE 114* 98  BUN 30* 22  CREATININE 0.79 0.73  CALCIUM 9.5 8.9    Studies: No results found.  Scheduled Meds: . ascorbic acid   Oral Q1200  .  cholecalciferol  1,000 Units Oral Daily  . feeding supplement (ENSURE ENLIVE)  237 mL Oral BID BM  . folic acid  1 mg Oral Daily  . galantamine  8 mg Oral BID  . hydrOXYzine  25 mg Oral BID  . propranolol  20 mg Oral Q1200  . tamsulosin  0.4 mg Oral Q1200   Continuous Infusions: PRN Meds: acetaminophen **OR** acetaminophen, magnesium hydroxide, morphine injection, ondansetron **OR** ondansetron (ZOFRAN) IV, oxyCODONE-acetaminophen, traZODone  Time spent: 35 minutes  Author: Gillis Santa. MD [facility] 08/28/2019 12:30 PM  To reach On-call, see care teams to locate the attending and reach out to them via [email or web address]. If 7PM-7AM, please contact night-coverage If you still have difficulty reaching the attending provider, please page the [facility] (Director on Call) for [facility] on amion for assistance.

## 2019-08-28 NOTE — Progress Notes (Signed)
Continues to have very limited mobility.  MRI T spine ordered

## 2019-08-28 NOTE — TOC Initial Note (Signed)
Transition of Care [facility]) - Initial/Assessment Note    Patient Details  Name: Austin Ramos MRN: [id number] Date of Birth: September 04, 1925  Transition of Care [facility]) CM/SW Contact:    Candie Chroman, LCSW Phone Number: 08/28/2019, 4:23 PM  Clinical Narrative:  CSW met with patient. Wife at bedside. CSW introduced role and explained that PT recommendations would be discussed. Patient and his wife live at [place]. They also have a niece that is very supportive. Initially patient was against SNF placement but after discussion with his wife, he is now agreeable. Top preferences are [facility] and [facility]. No further concerns. CSW encouraged patient and his wife to contact CSW as needed. CSW will continue to follow patient and his wife for support and facilitate discharge to SNF once medically stable.                Expected Discharge Plan: Skilled Nursing Facility Barriers to Discharge: Continued Medical Work up   Patient Goals and CMS Choice   CMS Medicare.gov Compare Post Acute Care list provided to:: Patient(and wife at bedside)    Expected Discharge Plan and Services Expected Discharge Plan: [place] Acute Care Choice: [place] Living arrangements for the past 2 months: [place]                                      Prior Living Arrangements/Services Living arrangements for the past 2 months: [place] Lives with:: Spouse Patient language and need for interpreter reviewed:: Yes Do you feel safe going back to the place where you live?: Yes      Need for Family Participation in Patient Care: Yes (Comment) Care giver support system in place?: Yes (comment)   Criminal Activity/Legal Involvement Pertinent to Current Situation/Hospitalization: No - Comment as needed  Activities of Daily Living Home Assistive Devices/Equipment: Grab bars in shower, Grab bars around toilet, Shower  chair without back ADL Screening (condition at time of admission) Patient's cognitive ability adequate to safely complete daily activities?: Yes Is the patient deaf or have difficulty hearing?: No Does the patient have difficulty seeing, even when wearing glasses/contacts?: No Does the patient have difficulty concentrating, remembering, or making decisions?: Yes Patient able to express need for assistance with ADLs?: Yes Does the patient have difficulty dressing or bathing?: Yes Independently performs ADLs?: No Communication: Independent Dressing (OT): Needs assistance Is this a change from baseline?: Pre-admission baseline Grooming: Needs assistance Is this a change from baseline?: Pre-admission baseline Feeding: Independent Bathing: Needs assistance Is this a change from baseline?: Pre-admission baseline Toileting: Needs assistance Is this a change from baseline?: Pre-admission baseline In/Out Bed: Needs assistance Is this a change from baseline?: Pre-admission baseline Walks in Home: Needs assistance Is this a change from baseline?: Pre-admission baseline Does the patient have difficulty walking or climbing stairs?: Yes Weakness of Legs: Both Weakness of Arms/Hands: Both  Permission Sought/Granted Permission sought to share information with : Facility Sport and exercise psychologist, Family Supports Permission granted to share information with : Yes, Verbal Permission Granted  Share Information with NAME: Wilmer Santillo  Permission granted to share info w AGENCY: SNF's  Permission granted to share info w Relationship: Wife  Permission granted to share info w Contact Information: [phone]  Emotional Assessment Appearance:: Appears stated age Attitude/Demeanor/Rapport: Engaged, Gracious Affect (typically observed): Accepting, Appropriate, Calm, Pleasant Orientation: : Oriented to  Self, Oriented to Place, Oriented to  Time, Oriented to Situation Alcohol / Substance Use: Not  Applicable Psych Involvement: No (comment)  Admission diagnosis:  Thoracic vertebral fracture [facility]) [S22.009A] Patient Active Problem List   Diagnosis Date Noted  . Thoracic vertebral fracture ([place]) 08/26/2019   PCP:  Rusty Aus, MD Pharmacy:   [place], [place] - [address] [place] [address] Phone: [phone] Fax: [phone]     Social Determinants of Health (SDOH) Interventions    Readmission Risk Interventions No flowsheet data found.

## 2019-08-28 NOTE — Progress Notes (Signed)
Physical Therapy Treatment Patient Details Name: Austin Ramos MRN: [id number] DOB: 04/16/1926 Today's Date: 08/28/2019    History of Present Illness Austin Ramos is a 74yoM who comes to [facility] on 08/26/19 after 2 recent falls and severe back/chest pain. Pt found to have fractures of thoracic vertebrae six and eight. Neurosurgical consult recommending TLSO/hyperextension brace and conservative management. Orthopedics consulted regarding appropriateness for kyphoplasty, recomming conservative management at this time if pt is able to be ambulatory. PMH: dyslipidemia and BPH. Pt reports chronic but progressively more frequent BLE cramp/spasm phenomenon in calves over the past couple months, etiology unclear at this time, but suspect contributory to recent falls. Previously pt was AMB without assitive device at home. Pt was formerly a pole vaulter.    PT Comments    Pt was long sitting in bed upon arriving. Lunch tray in front of him but did not eat much. He agrees to PT session and is cooperative throughout. TLSO back brace on and was recently adjusted with pt much more comfortable. He denies pain however is unable to tolerate HOB being lowered flat 2/2 to back pain. Pt exited L side of bed with Min assist required to safely perform. Increased time and vcs for maintaining spinal precautions. He was able to then stand with RW+ gait belt for safety with Mod assist from slightly elevated surface prior to ambulating 60 ft. He tolerated well but does fatigue quickly. Required min assist returning LEs back into bed and being repositioned towards HOB. Pt's spouse present throughout session and per conversation about d/c plan requested pt to go to SNF. He tolerated session well. PT will continue to follow pt per POC.       Follow Up Recommendations  SNF(per pt/pt's spouse, requesting to go SNF at D/C )     Equipment Recommendations  Rolling walker with 5" wheels;None recommended by PT    Recommendations for  Other Services       Precautions / Restrictions Precautions Precautions: Fall;Back Precaution Booklet Issued: No Required Braces or Orthoses: Spinal Brace Spinal Brace: Thoracolumbosacral orthotic Restrictions Weight Bearing Restrictions: No    Mobility  Bed Mobility Overal bed mobility: Needs Assistance Bed Mobility: Supine to Sit;Sit to Supine     Supine to sit: Min assist Sit to supine: Min assist   General bed mobility comments: Increased time to perform bed mobility and vcs throughout for technique. Therapist discussed log roll technique to protect back. pt is unable to tolerate laying flat supine 2/2 pain. modified log roll technique performed. Min assist progressing BLEs out/in bed.   Transfers Overall transfer level: Needs assistance Equipment used: Rolling walker (2 wheeled)(TLSO) Transfers: Sit to/from Stand Sit to Stand: Mod assist         General transfer comment: Mod assist to stand from slightly elevated bed height. INcreased time to perform with vcs for handplacement and technique.  Ambulation/Gait Ambulation/Gait assistance: Min assist Gait Distance (Feet): 60 Feet Assistive device: Rolling walker (2 wheeled) Gait Pattern/deviations: Step-through pattern Gait velocity: decreased   General Gait Details: pt continues to have heavy lean on UE however with vcs is able to correct gait posture and overall improve gait kinematics. Pt tolerated gait well. gait belt used for safety   Stairs             Wheelchair Mobility    Modified Rankin (Stroke Patients Only)       Balance Overall balance assessment: History of Falls;Needs assistance Sitting-balance support: Feet supported;No upper extremity supported Sitting balance-Leahy  Scale: Good Sitting balance - Comments: pt was able to safely maintain balance while seated EOB x 5 minutes with only BLEs support.    Standing balance support: Bilateral upper extremity supported;During functional  activity Standing balance-Leahy Scale: Fair Standing balance comment: heavy lean on RW in standing however no LOB or unsteadiness noted. he does have narrow BOS and was cued to widen                            Cognition Arousal/Alertness: Awake/alert Behavior During Therapy: WFL for tasks assessed/performed Overall Cognitive Status: Within Functional Limits for tasks assessed                                 General Comments: Pt is HOH but is A and O      Exercises      General Comments        Pertinent Vitals/Pain Pain Assessment: No/denies pain Pain Score: 0-No pain Pain Intervention(s): Monitored during session    Home Living                      Prior Function            PT Goals (current goals can now be found in the care plan section) Acute Rehab PT Goals Patient Stated Goal: " I'm feeling much better now that brace has been adjusted."  Progress towards PT goals: Progressing toward goals    Frequency    7X/week      PT Plan Current plan remains appropriate    Co-evaluation              AM-PAC PT "6 Clicks" Mobility   Outcome Measure  Help needed turning from your back to your side while in a flat bed without using bedrails?: A Little Help needed moving from lying on your back to sitting on the side of a flat bed without using bedrails?: A Little Help needed moving to and from a bed to a chair (including a wheelchair)?: A Lot Help needed standing up from a chair using your arms (e.g., wheelchair or bedside chair)?: A Lot Help needed to walk in hospital room?: A Lot Help needed climbing 3-5 steps with a railing? : A Lot 6 Click Score: 14    End of Session Equipment Utilized During Treatment: Back brace;Gait belt Activity Tolerance: Patient tolerated treatment well Patient left: in bed;with call bell/phone within reach;with bed alarm set;with family/visitor present;Other (comment)(spouse present throughout  session. ) Nurse Communication: Mobility status;Precautions PT Visit Diagnosis: Unsteadiness on feet (R26.81);Difficulty in walking, not elsewhere classified (R26.2);Other abnormalities of gait and mobility (R26.89);Muscle weakness (generalized) (M62.81);History of falling (Z91.81);Other symptoms and signs involving the nervous system (R29.898)     Time: 8115-7262 PT Time Calculation (min) (ACUTE ONLY): 20 min  Charges:  $Gait Training: 8-22 mins                     Jetta Lout PTA 08/28/19, 2:48 PM

## 2019-08-28 NOTE — NC FL2 (Signed)
[place] MEDICAID FL2 LEVEL OF CARE SCREENING TOOL     IDENTIFICATION  Patient Name: Austin Ramos Birthdate: Jul 01, 1926 Sex: male Admission Date (Current Location): 08/26/2019  [place] and [place] Number:  Chiropodist and Address:  [facility], [address] [place], [place] [address]      Provider Number: [id number]  Attending Physician Name and Address:  Gillis Santa, MD  Relative Name and Phone Number:       Current Level of Care: Hospital Recommended Level of Care: Skilled Nursing Facility Prior Approval Number:    Date Approved/Denied:   PASRR Number: [id number]  Discharge Plan: SNF    Current Diagnoses: Patient Active Problem List   Diagnosis Date Noted  . Thoracic vertebral fracture ([facility]) 08/26/2019    Orientation RESPIRATION BLADDER Height & Weight     Self, Time, Situation, Place  Normal Incontinent, External catheter Weight: 115 lb (52.2 kg) Height:  5\' 10"  (177.8 cm)  BEHAVIORAL SYMPTOMS/MOOD NEUROLOGICAL BOWEL NUTRITION STATUS  (None) (None) Incontinent Diet(DYS 3)  AMBULATORY STATUS COMMUNICATION OF NEEDS Skin   Limited Assist Verbally Skin abrasions                       Personal Care Assistance Level of Assistance  Bathing, Feeding, Dressing Bathing Assistance: Limited assistance Feeding assistance: Independent Dressing Assistance: Limited assistance     Functional Limitations Info  Sight, Hearing, Speech Sight Info: Adequate Hearing Info: Adequate Speech Info: Adequate    SPECIAL CARE FACTORS FREQUENCY  PT (By licensed PT), OT (By licensed OT)     PT Frequency: 5 x week OT Frequency: 5 x week            Contractures Contractures Info: Not present    Additional Factors Info  Code Status, Allergies Code Status Info: Full code Allergies Info: NKDA           Current Medications (08/28/2019):  This is the current hospital active medication list Current Facility-Administered  Medications  Medication Dose Route Frequency Provider Last Rate Last Admin  . acetaminophen (TYLENOL) tablet 650 mg  650 mg Oral Q6H PRN Mansy, Jan A, MD   650 mg at 08/28/19 10/26/19   Or  . acetaminophen (TYLENOL) suppository 650 mg  650 mg Rectal Q6H PRN Mansy, Jan A, MD      . ascorbic acid (VITAMIN C) tablet   Oral Q1200 Mansy, Jan A, MD   500 mg at 08/28/19 1224  . cholecalciferol (VITAMIN D3) tablet 1,000 Units  1,000 Units Oral Daily Mansy, Jan A, MD   1,000 Units at 08/28/19 1017  . feeding supplement (ENSURE ENLIVE) (ENSURE ENLIVE) liquid 237 mL  237 mL Oral BID BM 10/26/19, MD   237 mL at 08/28/19 1437  . folic acid (FOLVITE) tablet 1 mg  1 mg Oral Daily Mansy, Jan A, MD   1 mg at 08/28/19 1017  . galantamine (RAZADYNE) tablet 8 mg  8 mg Oral BID Mansy, Jan A, MD   8 mg at 08/28/19 1016  . hydrOXYzine (ATARAX/VISTARIL) tablet 25 mg  25 mg Oral BID Mansy, Jan A, MD   25 mg at 08/28/19 1016  . magnesium hydroxide (MILK OF MAGNESIA) suspension 30 mL  30 mL Oral Daily PRN Mansy, Jan A, MD      . morphine 2 MG/ML injection 2 mg  2 mg Intravenous Q4H PRN Mansy, Jan A, MD      . ondansetron [facility]) tablet 4 mg  4 mg Oral Q6H PRN Mansy, Jan A, MD       Or  . ondansetron [facility]) injection 4 mg  4 mg Intravenous Q6H PRN Mansy, Jan A, MD      . oxyCODONE-acetaminophen (PERCOCET/ROXICET) 5-325 MG per tablet 1-2 tablet  1-2 tablet Oral Q4H PRN Mansy, Jan A, MD      . propranolol (INDERAL) tablet 20 mg  20 mg Oral Q1200 Mansy, Jan A, MD   20 mg at 08/28/19 1224  . tamsulosin (FLOMAX) capsule 0.4 mg  0.4 mg Oral Q1200 Mansy, Jan A, MD   0.4 mg at 08/28/19 1224  . traZODone (DESYREL) tablet 25 mg  25 mg Oral QHS PRN Mansy, Arvella Merles, MD         Discharge Medications: Please see discharge summary for a list of discharge medications.  Relevant Imaging Results:  Relevant Lab Results:   Additional Information SS#: [id number]  Candie Chroman, LCSW

## 2019-08-28 NOTE — Progress Notes (Signed)
OT Cancellation Note  Patient Details Name: RAMIEL FORTI MRN: [id number] DOB: 1925/09/22   Cancelled Treatment:    Reason Eval/Treat Not Completed: Patient at procedure or test/ unavailable  OT consult received and chart reviewed. RN speaking with pt family re: MRI information at this time. Per RN, MRI of T-spine has not been completed at this time. Will f/u for OT evaluation as pt becomes available/as more imaging information becomes available. Thank you.   Rejeana Brock, MS, OTR/L ascom [phone] 08/28/19, 3:03 PM

## 2019-08-29 LAB — BASIC METABOLIC PANEL
Anion gap: 8 (ref 5–15)
BUN: 29 mg/dL — ABNORMAL HIGH (ref 8–23)
CO2: 27 mmol/L (ref 22–32)
Calcium: 9.1 mg/dL (ref 8.9–10.3)
Chloride: 102 mmol/L (ref 98–111)
Creatinine, Ser: 0.69 mg/dL (ref 0.61–1.24)
GFR calc Af Amer: >60 mL/min (ref >60)
GFR calc non Af Amer: >60 mL/min (ref >60)
Glucose, Bld: 115 mg/dL — ABNORMAL HIGH (ref 70–99)
Potassium: 4.3 mmol/L (ref 3.5–5.1)
Sodium: 137 mmol/L (ref 135–145)

## 2019-08-29 LAB — CBC
HCT: 41 % (ref 39.0–52.0)
Hemoglobin: 14.2 g/dL (ref 13.0–17.0)
MCH: 31.3 pg (ref 26.0–34.0)
MCHC: 34.6 g/dL (ref 30.0–36.0)
MCV: 90.3 fL (ref 80.0–100.0)
Platelets: 210 10*3/uL (ref 150–400)
RBC: 4.54 MIL/uL (ref 4.22–5.81)
RDW: 13.3 % (ref 11.5–15.5)
WBC: 8.9 10*3/uL (ref 4.0–10.5)
nRBC: 0 % (ref 0.0–0.2)

## 2019-08-29 LAB — PHOSPHORUS: Phosphorus: 3.1 mg/dL (ref 2.5–4.6)

## 2019-08-29 LAB — MAGNESIUM: Magnesium: 2 mg/dL (ref 1.7–2.4)

## 2019-08-29 NOTE — Progress Notes (Signed)
Physical Therapy Treatment Patient Details Name: Austin Ramos MRN: [id number] DOB: September 14, 1925 Today's Date: 08/29/2019    History of Present Illness [name] is a 80yoM who comes to [facility] on 08/26/19 after 2 recent falls and severe back/chest pain. Pt found to have fractures of thoracic vertebrae six and eight. Neurosurgical consult recommending TLSO/hyperextension brace and conservative management. Orthopedics consulted regarding appropriateness for kyphoplasty, recomming conservative management at this time if pt is able to be ambulatory. PMH: dyslipidemia and BPH. Pt reports chronic but progressively more frequent BLE cramp/spasm phenomenon in calves over the past couple months, etiology unclear at this time, but suspect contributory to recent falls. Previously pt was AMB without assitive device at home. Pt was formerly a pole vaulter.    PT Comments    Pt was long sitting in bed upon arriving without brace on. He agrees to PT session and OOB activity. Pt required total assist with correct placement and alignment of TLSO brace. Brace adjusted for comfort. Pt was able to exit R side of bed with increase time + Min A and vcs for technique and sequencing. Pt educated on techniques to protect back during activity. Pt was unable to tolerate HOB lower  than 40 degrees 2/2 pain. He stood from elevated bed height and ambulated 60 ft with RW min A + gait belt. He was repositioned in relciner post session with call bell in reach and chair alarm set. Therapist discussed with RN pt's progress post session. PT will continue to follow per POC and recommends d/c to SNF when medically ready.     Follow Up Recommendations  SNF;Other (comment)(pt/pt's spouse do not feel they can safely return home)     Equipment Recommendations  Rolling walker with 5" wheels;None recommended by PT    Recommendations for Other Services       Precautions / Restrictions Precautions Precautions: Fall;Back Precaution  Booklet Issued: No Required Braces or Orthoses: Spinal Brace Spinal Brace: Thoracolumbosacral orthotic Restrictions Weight Bearing Restrictions: No    Mobility  Bed Mobility Overal bed mobility: Needs Assistance Bed Mobility: Supine to Sit     Supine to sit: Min assist     General bed mobility comments: Increased time to perform exiting R side of bed. Brace applied while pt was in bed but required total assist for correct placement. Adjusted brace for proper fit.  Transfers Overall transfer level: Needs assistance Equipment used: Rolling walker (2 wheeled) Transfers: Sit to/from Stand Sit to Stand: Min assist;From elevated surface         General transfer comment: Pt was able to stand from EOB (elevated). Vcs for technique, sequencing, and safety.   Ambulation/Gait Ambulation/Gait assistance: Min assist Gait Distance (Feet): 60 Feet Assistive device: Rolling walker (2 wheeled) Gait Pattern/deviations: Step-through pattern Gait velocity: decreased   General Gait Details: Pt continues to rely heavily on UE during standing/gait training however demonstrated no LOB or unsteadiness. slow cadence but tolerated well.    Stairs             Wheelchair Mobility    Modified Rankin (Stroke Patients Only)       Balance Overall balance assessment: History of Falls;Needs assistance Sitting-balance support: Feet supported;No upper extremity supported Sitting balance-Leahy Scale: Good Sitting balance - Comments: pt sat EOB x several minutes prior to standing. No LOB noted   Standing balance support: Bilateral upper extremity supported;During functional activity Standing balance-Leahy Scale: Fair Standing balance comment: heavily relys on UE support  Cognition Arousal/Alertness: Awake/alert Behavior During Therapy: WFL for tasks assessed/performed Overall Cognitive Status: Within Functional Limits for tasks assessed                                  General Comments: Pt is HOH but is A and O      Exercises General Exercises - Lower Extremity Ankle Circles/Pumps: AROM;Both;20 reps;Supine Quad Sets: AROM;Both;10 reps;Supine Gluteal Sets: AROM;10 reps;Supine Long Arc Quad: AROM;10 reps;Supine Heel Slides: AROM;Both;10 reps;Supine Hip ABduction/ADduction: AROM;Both;10 reps;Supine Straight Leg Raises: AROM;Both;10 reps;Supine    General Comments        Pertinent Vitals/Pain Pain Assessment: No/denies pain Pain Score: 0-No pain Faces Pain Scale: Hurts a little bit Pain Location: mid back Pain Descriptors / Indicators: Sharp Pain Intervention(s): Monitored during session    Home Living                      Prior Function            PT Goals (current goals can now be found in the care plan section) Acute Rehab PT Goals Patient Stated Goal: " I'm ready for rehab" Progress towards PT goals: Progressing toward goals    Frequency    7X/week      PT Plan Current plan remains appropriate    Co-evaluation              AM-PAC PT "6 Clicks" Mobility   Outcome Measure  Help needed turning from your back to your side while in a flat bed without using bedrails?: A Little Help needed moving from lying on your back to sitting on the side of a flat bed without using bedrails?: A Little Help needed moving to and from a bed to a chair (including a wheelchair)?: A Little Help needed standing up from a chair using your arms (e.g., wheelchair or bedside chair)?: A Lot Help needed to walk in hospital room?: A Lot Help needed climbing 3-5 steps with a railing? : A Lot 6 Click Score: 15    End of Session Equipment Utilized During Treatment: Back brace;Gait belt Activity Tolerance: Patient tolerated treatment well Patient left: in chair;with chair alarm set;with call bell/phone within reach Nurse Communication: Mobility status;Precautions PT Visit Diagnosis: Unsteadiness on feet  (R26.81);Difficulty in walking, not elsewhere classified (R26.2);Other abnormalities of gait and mobility (R26.89);Muscle weakness (generalized) (M62.81);History of falling (Z91.81);Other symptoms and signs involving the nervous system (R29.898)     Time: 5361-4431 PT Time Calculation (min) (ACUTE ONLY): 28 min  Charges:  $Gait Training: 8-22 mins $Therapeutic Exercise: 8-22 mins                     Jetta Lout PTA 08/29/19, 9:54 AM

## 2019-08-29 NOTE — Care Management Important Message (Signed)
Important Message  Patient Details  Name: Austin Ramos MRN: [id number] Date of Birth: April 02, 1926   Medicare Important Message Given:  Yes     Johnell Comings 08/29/2019, 1:10 PM

## 2019-08-29 NOTE — Progress Notes (Signed)
MRI showed slight edema and T6 showing acute fracture with very little compression.  Also showed pleural effusion.  With his presentation with him being more comfortable sitting up I do not think a kyphoplasty is likely be beneficial.

## 2019-08-29 NOTE — Progress Notes (Signed)
[facility] Progress Note  Patient: Austin Ramos    BMW:[id number]  DOA: 08/26/2019     Date of Service: the patient was seen and examined on 08/29/2019  Chief Complaint  Patient presents with  . Fall   Brief hospital course:  Austin Ramos a89 y.o.malewith a known history of dyslipidemia and BPH, presented to the emergency room with acute onset of fall while he was at his primary care physician's office after coming out of the bathroom and turning them losing his balance and falling hitting his forehead.   Currently further plan is to get Covid test today and disposition tomorrow to SNF   Assessment and Plan:  # Mechanical fall with subsequent T6 endplate and T8 vertebral compression fracture (25% height loss) -Pain management will be provided.  - Physical therapy consult  -orthopedic consulted, seen by Dr. Rudene Christians, MRI T spine done, and he does not feel that patient will benefit from kyphoplasty. MRI: 1. Subtle nondisplaced fracture of the T6 vertebral level with minimal loss of height, less than 10%. 2. Remote 25% superior endplate compression fracture at T7. 3. Mild left foraminal impingement at L1-2 due to a left foraminal disc protrusion. 4. Dextroconvex thoracic scoliosis with rotary component. 5. Trace left pleural effusion and trace perisplenic fluid.  # BPH. continue Flomax.  # HL, -takes herbal meds, and propranolol   Body mass index is 16.5 kg/m.  Nutrition Problem: Predicted suboptimal nutrient intake Etiology: social / environmental circumstances(dementia, advanced age) Interventions:    Diet: Heart healthy DVT Prophylaxis: Subcutaneous Lovenox held patient might need kyphoplasty  Advance goals of care discussion: Full code  Family Communication: family was not present at bedside, at the time of interview.  Discussed with wife over the phone she was in the room on 08/28/19, the pt provided permission to discuss medical plan with the family.  Opportunity was given to ask question and all questions were answered satisfactorily.   Disposition:  Pt is from Home, admitted with fall and thoracic spine fracture, MRI T-spine done, patient is medically stable, accepted bed offer at SNF, plan is to order Covid test today for disposition tomorrow a.m.   Subjective: Patient was seen and examined at the bedside, and is feeling much better now but he hates thoracic spine brace.  Feeling comfortable while sitting in the chair, it hurts when he moves but it is also under control with the brace. Pt denied any chest pain or palpitations, no shortness of breath.  No abdominal pain no nausea vomiting or diarrhea.   Physical Exam: General:  alert oriented to time, place, and person.  Appear in mild distress, affect appropriate Eyes: PERRL ENT: Oral Mucosa Clear, moist  Neck: no JVD,  Cardiovascular: S1 and S2 Present, no audible murmur,  Respiratory: good respiratory effort, Bilateral Air entry equal and Decreased, no crackles, no wheezes.  Thoracic breast intake Abdomen: Bowel Sound +, Soft and non tenderness,  Skin: no rashes Extremities: no Pedal edema, no calf tenderness Neurologic: without any new focal findings Gait not checked due to patient safety concerns  Vitals:   08/28/19 0953 08/28/19 1311 08/28/19 1930 08/29/19 0417  BP: (!) 124/56 (!) 138/55 (!) 116/50 131/67  Pulse: 78 71 71 65  Resp: 18 16 20 18   Temp:  (!) 97.5 F (36.4 C) 98.5 F (36.9 C) 97.7 F (36.5 C)  TempSrc:  Oral Oral Oral  SpO2: 97% 95% 95% 94%  Weight:      Height:  Intake/Output Summary (Last 24 hours) at 08/29/2019 1400 Last data filed at 08/29/2019 1055 Gross per 24 hour  Intake 358 ml  Output 650 ml  Net -292 ml   Filed Weights   08/26/19 1803  Weight: 52.2 kg    Data Reviewed: I have personally reviewed and interpreted daily labs, tele strips, imagings as discussed above. I reviewed all nursing notes, pharmacy notes, vitals, pertinent  old records I have discussed plan of care as described above with RN and patient/family.  CBC: Recent Labs  Lab 08/26/19 1809 08/27/19 0549 08/29/19 0404  WBC 8.0 8.0 8.9  HGB 14.5 14.0 14.2  HCT 42.5 41.4 41.0  MCV 90.2 90.4 90.3  PLT 214 184 210   Basic Metabolic Panel: Recent Labs  Lab 08/26/19 1809 08/27/19 0549 08/29/19 0404  NA 135 136 137  K 4.7 3.9 4.3  CL 100 102 102  CO2 26 26 27   GLUCOSE 114* 98 115*  BUN 30* 22 29*  CREATININE 0.79 0.73 0.69  CALCIUM 9.5 8.9 9.1  MG  --   --  2.0  PHOS  --   --  3.1    Studies: MR THORACIC SPINE WO CONTRAST  Result Date: 08/28/2019 CLINICAL DATA:  Fall 1 week ago, thoracic pain. EXAM: MRI THORACIC SPINE WITHOUT CONTRAST TECHNIQUE: Multiplanar, multisequence MR imaging of the thoracic spine was performed. No intravenous contrast was administered. COMPARISON:  CT chest from 08/26/2019 FINDINGS: Alignment:  Dextroconvex thoracic scoliosis with rotary component. Vertebrae: Subtle nondisplaced fracture of the T6 vertebral level with components along both the inferior and superior endplate currently there is minimal loss of height, less than 10%. Remote 25% superior endplate compression fracture at T8 without any vertebral edema at the T8 level. No findings of posterior bony retropulsion. Cord:  No significant abnormal spinal cord signal is observed. Paraspinal and other soft tissues: No significant paraspinal hematoma at the level of the T6 fracture. There is a trace left pleural effusion and probably a trace amount of perisplenic fluid. Disc levels: No appreciable impingement in the thoracic spine. There is likely mild left foraminal impingement at the L1-2 level due to a left foraminal disc protrusion as suggested on image 18/19. IMPRESSION: 1. Subtle nondisplaced fracture of the T6 vertebral level with minimal loss of height, less than 10%. 2. Remote 25% superior endplate compression fracture at T7. 3. Mild left foraminal impingement at  L1-2 due to a left foraminal disc protrusion. 4. Dextroconvex thoracic scoliosis with rotary component. 5. Trace left pleural effusion and trace perisplenic fluid. Electronically Signed   By: 08-06-1976 M.D.   On: 08/28/2019 19:09    Scheduled Meds: . ascorbic acid   Oral Q1200  . cholecalciferol  1,000 Units Oral Daily  . feeding supplement (ENSURE ENLIVE)  237 mL Oral BID BM  . folic acid  1 mg Oral Daily  . galantamine  8 mg Oral BID  . hydrOXYzine  25 mg Oral BID  . propranolol  20 mg Oral Q1200  . tamsulosin  0.4 mg Oral Q1200   Continuous Infusions: PRN Meds: acetaminophen **OR** acetaminophen, magnesium hydroxide, morphine injection, ondansetron **OR** ondansetron (ZOFRAN) IV, oxyCODONE-acetaminophen, traZODone  Time spent: 35 minutes  Author: 10/26/2019. MD [facility] 08/29/2019 2:00 PM  To reach On-call, see care teams to locate the attending and reach out to them via [email or web address]. If 7PM-7AM, please contact night-coverage If you still have difficulty reaching the attending provider, please page the [facility] (Director on Call) for [facility]  on amion for assistance.

## 2019-08-29 NOTE — TOC Progression Note (Signed)
Transition of Care [facility]) - Progression Note    Patient Details  Name: Austin Ramos MRN: [id number] Date of Birth: 1925/10/14  [facility] CM/SW Contact  Margarito Liner, LCSW Phone Number: 08/29/2019, 12:51 PM  Clinical Narrative: Patient and his wife have accepted bed offer from [facility]. Admissions coordinator is aware. Sent message to MD to see if there is an anticipated discharge date.    Expected Discharge Plan: Skilled Nursing Facility Barriers to Discharge: Continued Medical Work up  Expected Discharge Plan and Services Expected Discharge Plan: Skilled Nursing Facility     Post Acute Care Choice: Skilled Nursing Facility Living arrangements for the past 2 months: Independent Living Facility                                       Social Determinants of Health (SDOH) Interventions    Readmission Risk Interventions No flowsheet data found.

## 2019-08-29 NOTE — Evaluation (Signed)
Occupational Therapy Evaluation Patient Details Name: Austin Ramos MRN: [id number] DOB: [birth year] Today's Date: 08/29/2019    History of Present Illness Terry Bolotin is a 89yoM who comes to [facility] on 08/26/19 after 2 recent falls and severe back/chest pain. Pt found to have fractures of thoracic vertebrae six and eight. Neurosurgical consult recommending TLSO/hyperextension brace and conservative management. Orthopedics consulted regarding appropriateness for kyphoplasty, recomming conservative management at this time if pt is able to be ambulatory. PMH: dyslipidemia and BPH. Pt reports chronic but progressively more frequent BLE cramp/spasm phenomenon in calves over the past couple months, etiology unclear at this time, but suspect contributory to recent falls. Previously pt was AMB without assitive device at home. Pt was formerly a pole vaulter.   Clinical Impression   Pt was seen for OT evaluation this date. Prior to hospital admission, pt was Indep with no AD for fxl mobility and Indep for ADLs. Pt lives in [place] with his spouse at [facility]. Currently pt demonstrates impairments as described below (See OT problem list) which functionally limit his ability to perform ADL/self-care tasks. Pt currently requires MIN/MOD A for ADL transfers, Demos Poor to Fair standing balance with RW. Requires MAX A for LB ADLs (able to don socks using cross leg technique with setup, but for standing clothing mgt, to adhere to back precautions-difficulty with simulating managing pants/underwear, requires MAX A).   Pt would benefit from skilled OT to address noted impairments and functional limitations (see below for any additional details) in order to maximize safety and independence while minimizing falls risk and caregiver burden. Upon hospital discharge, recommend STR to maximize pt safety and return to PLOF.     Follow Up Recommendations  SNF    Equipment Recommendations  3 in 1 bedside commode     Recommendations for Other Services       Precautions / Restrictions Precautions Precautions: Fall;Back Precaution Booklet Issued: No Required Braces or Orthoses: Spinal Brace Spinal Brace: Thoracolumbosacral orthotic Restrictions Weight Bearing Restrictions: No      Mobility Bed Mobility               General bed mobility comments: recieved sitting up in chair  Transfers Overall transfer level: Needs assistance Equipment used: Rolling walker (2 wheeled) Transfers: Sit to/from Stand Sit to Stand: Min assist;Mod assist         General transfer comment: Pt was able to stand from EOB (elevated). Vcs for technique, sequencing, and safety.     Balance Overall balance assessment: History of Falls;Needs assistance Sitting-balance support: Feet supported;No upper extremity supported Sitting balance-Leahy Scale: Good     Standing balance support: Bilateral upper extremity supported;During functional activity Standing balance-Leahy Scale: Fair Standing balance comment: heavy use of UEs through RW for balance. requires MIN to CGA for static standing balance                           ADL either performed or assessed with clinical judgement   ADL                                         General ADL Comments: MIN/MOD A with UB dressing d/t limited UE ROM, MAX A with LB Dressing d/t limited LE ROM and adherance to precautions. Pt requires MIN A with ADL transfers.     Vision Baseline Vision/History: Wears glasses  Wears Glasses: At all times Patient Visual Report: No change from baseline       Perception     Praxis      Pertinent Vitals/Pain Pain Assessment: Faces Faces Pain Scale: Hurts a little bit Pain Location: mid back Pain Descriptors / Indicators: Sharp Pain Intervention(s): Monitored during session     Hand Dominance Right   Extremity/Trunk Assessment Upper Extremity Assessment Upper Extremity Assessment: Overall WFL for  tasks assessed(MMT not assessed d/t precautions, but pt moves UEs through ~1/2 arc of motion)   Lower Extremity Assessment Lower Extremity Assessment: Defer to PT evaluation;Generalized weakness   Cervical / Trunk Assessment Cervical / Trunk Assessment: Kyphotic   Communication Communication Communication: No difficulties   Cognition Arousal/Alertness: Awake/alert Behavior During Therapy: WFL for tasks assessed/performed Overall Cognitive Status: Within Functional Limits for tasks assessed                                 General Comments: Pt is HOH but is A and O   General Comments       Exercises Other Exercises Other Exercises: OT facilitates education with pt re: role of OT with pt verbalizing understanding. Other Exercises: OT faciltiates education with pt re: safety and fall prevention considerations including notifying pt of chair alarm and encouraging call bell use for fxl mobility/ADL transfers in acute setting.   Shoulder Instructions      Home Living Family/patient expects to be discharged to:: Private residence([facility]) Living Arrangements: Spouse/significant other Available Help at Discharge: Family Type of Home: Independent living facility Home Access: Level entry     Home Layout: One level               Home Equipment: None          Prior Functioning/Environment Level of Independence: Independent        Comments: 2 recent falls, 4-6x total in past month without insult; has trouble with buttoning shirts, slight UE tremors/decreased fine motor coordination noted, states wife has to help him button shirts.        OT Problem List: Decreased strength;Decreased range of motion;Decreased activity tolerance;Impaired balance (sitting and/or standing);Decreased knowledge of use of DME or AE;Pain      OT Treatment/Interventions: Self-care/ADL training;Therapeutic exercise;DME and/or AE instruction;Therapeutic  activities;Patient/family education;Balance training    OT Goals(Current goals can be found in the care plan section) Acute Rehab OT Goals Patient Stated Goal: to move around better on my own OT Goal Formulation: With patient Time For Goal Achievement: 09/12/19 Potential to Achieve Goals: Good  OT Frequency: Min 1X/week   Barriers to D/C:            Co-evaluation              AM-PAC OT "6 Clicks" Daily Activity     Outcome Measure Help from another person eating meals?: None Help from another person taking care of personal grooming?: A Little Help from another person toileting, which includes using toliet, bedpan, or urinal?: A Lot Help from another person bathing (including washing, rinsing, drying)?: A Lot Help from another person to put on and taking off regular upper body clothing?: A Little Help from another person to put on and taking off regular lower body clothing?: A Lot 6 Click Score: 16   End of Session Equipment Utilized During Treatment: Rolling walker;Back brace Nurse Communication: Mobility status  Activity Tolerance: Patient tolerated treatment well Patient  left: in chair;with call bell/phone within reach;with chair alarm set  OT Visit Diagnosis: Unsteadiness on feet (R26.81);Muscle weakness (generalized) (M62.81)                Time: 4709-6283 OT Time Calculation (min): 47 min Charges:  OT General Charges $OT Visit: 1 Visit OT Evaluation $OT Eval Moderate Complexity: 1 Mod OT Treatments $Self Care/Home Management : 8-22 mins $Therapeutic Activity: 8-22 mins  Rejeana Brock, MS, OTR/L ascom [phone] 08/29/19, 1:39 PM

## 2019-08-30 LAB — BASIC METABOLIC PANEL
Anion gap: 9 (ref 5–15)
BUN: 29 mg/dL — ABNORMAL HIGH (ref 8–23)
CO2: 24 mmol/L (ref 22–32)
Calcium: 9 mg/dL (ref 8.9–10.3)
Chloride: 101 mmol/L (ref 98–111)
Creatinine, Ser: 0.7 mg/dL (ref 0.61–1.24)
GFR calc Af Amer: >60 mL/min (ref >60)
GFR calc non Af Amer: >60 mL/min (ref >60)
Glucose, Bld: 106 mg/dL — ABNORMAL HIGH (ref 70–99)
Potassium: 4.1 mmol/L (ref 3.5–5.1)
Sodium: 134 mmol/L — ABNORMAL LOW (ref 135–145)

## 2019-08-30 LAB — CBC
HCT: 42.1 % (ref 39.0–52.0)
Hemoglobin: 14.2 g/dL (ref 13.0–17.0)
MCH: 30.5 pg (ref 26.0–34.0)
MCHC: 33.7 g/dL (ref 30.0–36.0)
MCV: 90.5 fL (ref 80.0–100.0)
Platelets: 193 10*3/uL (ref 150–400)
RBC: 4.65 MIL/uL (ref 4.22–5.81)
RDW: 13.4 % (ref 11.5–15.5)
WBC: 8.9 10*3/uL (ref 4.0–10.5)
nRBC: 0 % (ref 0.0–0.2)

## 2019-08-30 LAB — SARS CORONAVIRUS 2 (TAT 6-24 HRS): SARS Coronavirus 2: NEGATIVE

## 2019-08-30 MED ORDER — ACETAMINOPHEN 325 MG PO TABS: 650.0000 mg | ORAL_TABLET | Freq: Four times a day (QID) | ORAL | Status: AC | PRN

## 2019-08-30 NOTE — Progress Notes (Signed)
Physical Therapy Treatment Patient Details Name: Austin Ramos MRN: [id number] DOB: 1925/11/06 Today's Date: 08/30/2019    History of Present Illness Austin Ramos is a 9yoM who comes to [facility] on 08/26/19 after 2 recent falls and severe back/chest pain. Pt found to have fractures of thoracic vertebrae six and eight. Neurosurgical consult recommending TLSO/hyperextension brace and conservative management. Orthopedics consulted regarding appropriateness for kyphoplasty, recomming conservative management at this time if pt is able to be ambulatory. PMH: dyslipidemia and BPH. Pt reports chronic but progressively more frequent BLE cramp/spasm phenomenon in calves over the past couple months, etiology unclear at this time, but suspect contributory to recent falls. Previously pt was AMB without assitive device at home. Pt was formerly a pole vaulter.    PT Comments    Pt in bed upon entry with RN in room. Pt assisted to EOB minA, assisted with donning of TLSO. ModA to rise to standing. [place]inguard for AMB with RW, slow, steady, safe RW use. Pt able to perform repeated STS safely with RW from elevated EOB. Pt left up in chair, made comfortable, all needs met, wife in room [place] finds a guest chair for her to use while visiting.     Follow Up Recommendations  SNF;Other (comment)     Equipment Recommendations  Rolling walker with 5" wheels;None recommended by PT    Recommendations for Other Services       Precautions / Restrictions Precautions Precautions: Fall;Back Precaution Booklet Issued: No Required Braces or Orthoses: Spinal Brace Spinal Brace: Thoracolumbosacral orthotic    Mobility  Bed Mobility Overal bed mobility: Needs Assistance Bed Mobility: Supine to Sit     Supine to sit: Min assist     General bed mobility comments: needs arms to pull self forward, cues to avoid flexion while scooting to EOB  Transfers Overall transfer level: Needs assistance Equipment used: Rolling  walker (2 wheeled) Transfers: Sit to/from Stand Sit to Stand: Mod assist            Ambulation/Gait Ambulation/Gait assistance: Min guard Gait Distance (Feet): 90 Feet Assistive device: Rolling walker (2 wheeled) Gait Pattern/deviations: Step-to pattern     General Gait Details: improving but still limited, able to continuously propel RW   Stairs             Wheelchair Mobility    Modified Rankin (Stroke Patients Only)       Balance                                            Cognition Arousal/Alertness: Awake/alert Behavior During Therapy: WFL for tasks assessed/performed Overall Cognitive Status: Within Functional Limits for tasks assessed                                 General Comments: HOH, some forgetfulness, generally WFL      Exercises Other Exercises Other Exercises: STS with RW from eleavted EOB, supervision level x3    General Comments        Pertinent Vitals/Pain Pain Assessment: No/denies pain    Home Living                      Prior Function            PT Goals (current goals can now be found in the  care plan section) Acute Rehab PT Goals Patient Stated Goal: to move around better on my own PT Goal Formulation: With patient Time For Goal Achievement: 09/10/19 Potential to Achieve Goals: Good Progress towards PT goals: Progressing toward goals    Frequency    7X/week      PT Plan Current plan remains appropriate    Co-evaluation              AM-PAC PT "6 Clicks" Mobility   Outcome Measure  Help needed turning from your back to your side while in a flat bed without using bedrails?: A Little Help needed moving from lying on your back to sitting on the side of a flat bed without using bedrails?: A Little Help needed moving to and from a bed to a chair (including a wheelchair)?: A Little Help needed standing up from a chair using your arms (e.g., wheelchair or bedside  chair)?: A Lot Help needed to walk in hospital room?: A Lot Help needed climbing 3-5 steps with a railing? : A Lot 6 Click Score: 15    End of Session Equipment Utilized During Treatment: Back brace Activity Tolerance: Patient tolerated treatment well;No increased pain Patient left: in chair;with chair alarm set;with call bell/phone within reach;with family/visitor present Nurse Communication: Mobility status;Precautions PT Visit Diagnosis: Unsteadiness on feet (R26.81);Difficulty in walking, not elsewhere classified (R26.2);Other abnormalities of gait and mobility (R26.89);Muscle weakness (generalized) (M62.81);History of falling (Z91.81);Other symptoms and signs involving the nervous system (R29.898)     Time: 9802-2179 PT Time Calculation (min) (ACUTE ONLY): 26 min  Charges:  $Gait Training: 8-22 mins $Therapeutic Exercise: 8-22 mins                . 11:19 AM, 08/30/19 Etta Grandchild, PT, DPT Physical Therapist - [facility]  [phone] ([place])    Austin Ramos 08/30/2019, 11:17 AM

## 2019-08-30 NOTE — Progress Notes (Signed)
Austin Ramos and O x4. VSS. Pt tolerating diet well. No complaints of nausea or vomiting. IV removed intact, prescriptions given. Discharge instructions sent with patient. Patient discharged via stretcher with EMS  Allergies as of 08/30/2019   No Known Allergies     Medication List    TAKE these medications   acetaminophen 325 MG tablet Commonly known as: TYLENOL Take 2 tablets (650 mg total) by mouth every 6 (six) hours as needed for mild pain, fever or headache (or Fever >/= 101).   Cholecalciferol 25 MCG (1000 UT) tablet Take 1,000 Units by mouth daily.   EMERGEN-C VITAMIN C PO Take 1 tablet by mouth daily at 12 noon.   folic acid 1 MG tablet Commonly known as: FOLVITE Take 1 mg by mouth daily.   galantamine 8 MG tablet Commonly known as: RAZADYNE Take 8 mg by mouth 2 (two) times daily.   hydrOXYzine 25 MG tablet Commonly known as: ATARAX/VISTARIL Take 25 mg by mouth 2 (two) times daily.   NON FORMULARY Take 2 capsules by mouth daily before breakfast. Herbal supplement: Triveratrol Gold   propranolol 20 MG tablet Commonly known as: INDERAL Take 1 tablet (20 mg total) by mouth daily at 12 noon. Hold if SBP <120 and or HR <65 What changed: additional instructions   tamsulosin 0.4 MG Caps capsule Commonly known as: FLOMAX Take 0.4 mg by mouth daily at 12 noon.       Vitals:   08/30/19 0400 08/30/19 1030  BP: (!) 152/47 114/69  Pulse: (!) 59 (!) 57  Resp: 20   Temp: 98 F (36.7 C)   SpO2: 97%     Austin Ramos

## 2019-08-30 NOTE — Evaluation (Signed)
Clinical/Bedside Swallow Evaluation Patient Details  Name: Austin Ramos MRN: [id number] Date of Birth: 1925-08-01  Today's Date: 08/30/2019 Time: SLP Start Time (ACUTE ONLY): 1205 SLP Stop Time (ACUTE ONLY): 1255 SLP Time Calculation (min) (ACUTE ONLY): 50 min  Past Medical History:  Past Medical History:  Diagnosis Date  . BPH (benign prostatic hyperplasia)   . Hyperlipidemia    Past Surgical History: History reviewed. No pertinent surgical history. HPI:  Pt  is a 86 y.o. male with a known history of dyslipidemia and BPH and Dementia (per WIfe, chart notes), presented to the emergency room with acute onset of fall while he was at his primary care physician's office after coming out of the bathroom and turning them losing his balance and falling hitting his forehead.  There was no loss of consciousness, paresthesias other neurological deficits.  He then complained of mid back pain.  He had another fall about a week ago without significant injuries.  Imaging revealed Nondisplaced fracture through the anteroinferior endplate and mid body of T6.  Dietician reported weight loss and poor appetite; Wife endorsed. (noted BMI, weight in chart).  Head CT revealed age related atrophy, small vessel ischemic disease.  CXR: "No active cardiopulmonary disease".    Assessment / Plan / Recommendation Clinical Impression  Pt appears to present w/ grossly adequate oropharyngeal phase swallowing function w/ no immediate, overt clinical s/s of aspriation noted during/post oral intake w/ trials given. Current CXR: "No active cardiopulmonary disease". Pt does have a Baseline of Cognitive decline, "Dementia", per Wife and chart notes. He is also min restricted in upper body ROM d/t TLSO/hyperextension brace s/p recent falls. These factors can increase risk for aspiration. Noted Dietician's note indicating reduced oral intake in general w/ weight loss. During this exam, pt preferred to drink Milk (2 each meal per  Wife's report). He declined offers of food trials. No immediate overt s/s of aspiration noted w/ Straw sips of Milk, water. No overt coughing or wet vocal quality or decline in respiratory effort immediately following. However, intermittent, mild throat clearing occurred b/t trials when pt was talking w/ Wife and SLP. This throat clearing did not increase w/ further sips of liquids, nor was it consistent. Oral phase appeared [facility] for bolus management of the liquids and few puree trials; oral clearing achieved. Pt declined trials of solid foods. OM exam appeared [facility]; no unilateral weakness noted, speech clear. Pt fed self w/ support of a built-up utensil handle provided by SLP - pt has restricted use of hands for gripping d/t apparent arthritis(?). The built-up handle provided extra gripping support, and pt was able to self-feed adequately. Recommend continue w/ current diet w/ consideration of mech soft foods/meats for easier mastication and conservation of energy (though pt stated he did not eat meat); thin liquids. Recommend general aspiration precautions; Pills in Puree for safer swallowing; tray setup and support at meals as needed. Built-up handle on utensils for more secure gripping. Recommend Dietician f/u for drink supplement support as pt does not desire many foods. NSG to monitor pt's toleration of diet and Pulmonary status; reconsult ST services at SNF if needs arise. Education and handout given to Wife for d/c.  SLP Visit Diagnosis: Dysphagia, unspecified (R13.10)    Aspiration Risk  Mild aspiration risk;Risk for inadequate nutrition/hydration(reduced when following general precs)    Diet Recommendation  Regular diet but More Soft, cooked foods Cut well for conservation of energy/mastication; Thin liquids. General aspiration precautions. Support at meals for tray setup and  use of a Built-up handle for easier Grip of Utensil for pt  Medication Administration: Whole meds with puree(as needed for  safer swallowing)    Other  Recommendations Recommended Consults: (Dietician f/u; Palliative Care f/u for [facility] ) Oral Care Recommendations: Oral care BID;Oral care before and after PO;Staff/trained caregiver to provide oral care Other Recommendations: (n/a)   Follow up Recommendations None      Frequency and Duration (n/a)  (n/a)       Prognosis Prognosis for Safe Diet Advancement: Fair Barriers to Reach Goals: Cognitive deficits;Time post onset;Severity of deficits;Motivation      Swallow Study   General Date of Onset: 08/26/19 HPI: Pt  is a 86 y.o. male with a known history of dyslipidemia and BPH and Dementia (per WIfe, chart notes), presented to the emergency room with acute onset of fall while he was at his primary care physician's office after coming out of the bathroom and turning them losing his balance and falling hitting his forehead.  There was no loss of consciousness, paresthesias other neurological deficits.  He then complained of mid back pain.  He had another fall about a week ago without significant injuries.  Imaging revealed Nondisplaced fracture through the anteroinferior endplate and mid body of T6.  Dietician reported weight loss and poor appetite; Wife endorsed. (noted BMI, weight in chart).  Head CT revealed age related atrophy, small vessel ischemic disease.  CXR: "No active cardiopulmonary disease".  Type of Study: Bedside Swallow Evaluation Previous Swallow Assessment: none reported Diet Prior to this Study: Regular;Thin liquids Temperature Spikes Noted: No(wbc 8.9) Respiratory Status: Room air History of Recent Intubation: No Behavior/Cognition: Alert;Cooperative;Pleasant mood;Confused;Distractible;Requires cueing(baseline decilne in Cognition per Wife) Oral Cavity Assessment: Within Functional Limits Oral Care Completed by SLP: Recent completion by staff(pt w/ his Lunch meal) Oral Cavity - Dentition: Adequate natural dentition Vision: Functional for  self-feeding Self-Feeding Abilities: Able to feed self;Needs set up;Needs assist(restricted use of hands - arthritis?) Patient Positioning: Upright in chair Baseline Vocal Quality: Normal Volitional Cough: Strong Volitional Swallow: Able to elicit    Oral/Motor/Sensory Function Overall Oral Motor/Sensory Function: Within functional limits   Ice Chips Ice chips: Not tested   Thin Liquid Thin Liquid: Within functional limits(grossly) Presentation: Self Fed;Straw(baseline use; ~6 ozs of milk) Other Comments: noted a mild, inconsistent throat clear in between trials when talking    Nectar Thick Nectar Thick Liquid: Not tested   Honey Thick Honey Thick Liquid: Not tested   Puree Puree: Within functional limits Presentation: Self Fed;Spoon(3 trials)   Solid     Solid: Not tested Other Comments: pt declined       Orinda Kenner, MS, CCC-SLP [place] Tidwell 08/30/2019,3:26 PM

## 2019-08-30 NOTE — Discharge Summary (Signed)
[facility] Discharge Summary   Patient: Austin Ramos OEU:[id number]  PCP: Rusty Aus, MD  Date of admission: 08/26/2019   Date of discharge:  08/30/2019     Discharge Diagnoses:   Active Problems:   Thoracic vertebral fracture (HCC)   Admitted From: [facility] Disposition:  SNF   Recommendations for Outpatient Follow-up:  1. PCP: PCP in 1 week 2. Follow-up with orthopedics in 1 to 2 weeks  Contact information for after-discharge care    [facility] .   Service: Skilled Nursing Contact information: [place] [phone]             Diet recommendation: Cardiac diet, Dysphagia 3 diet   Activity: The patient is advised to gradually reintroduce usual activities, as tolerated  Discharge Condition: stable  Code Status: Full code   History of present illness: As per the H and P dictated on admission.  Hospital Course:  [name] a89 y.o.malewith a known history of dyslipidemia and BPH, presented to the emergency room with acute onset of fall while he was at his primary care physician's office after coming out of the bathroom and turning them losing his balance and falling hitting his forehead.  Assessment and Plan: # Mechanical fall with subsequent T6endplateand T8 vertebral compressionfracture(25% height loss), patient required 1 dose of morphine on 08/28/2019, did not require any opiates.  Continue Tylenol as needed for pain control.  Continue thoracic brace for back support and continue PT. -orthopedic consulted, seen by Dr. Rudene Christians, MRI T spine done, and he does not feel that patient will benefit from kyphoplasty. MRI: 1. Subtle nondisplaced fracture of the T6 vertebral level with minimal loss of height, less than 10%. 2. Remote 25% superior endplate compression fracture at T7. 3. Mild left foraminal impingement at L1-2 due to a left foraminal disc protrusion. 4.  Dextroconvex thoracic scoliosis with rotary component. 5. Trace left pleural effusion and trace perisplenic fluid. # BPH.continue Flomax. # HL, -takes herbal meds, and propranolol.  Blood pressure soft and slightly bradycardic today morning, so added parameters to hold if SBP <120 and or HR <65 Body mass index is 16.5 kg/m.  Nutrition Problem: Predicted suboptimal nutrient intake Etiology: social / environmental circumstances(dementia, advanced age) Nutrition Interventions: Seen by dietitian.  Continue Ensure Enlive p.o. twice daily each supplement provides 350 kcal and 20 grams of protein.  Magic cup TID with meals, each supplement provides 290 kcal and 9 grams of protein Dysphagia 3 diet   Pain control adequate, continue Tylenol as needed, no opiates. - [place] Controlled Substance Reporting System database was not reviewed. - Patient was instructed, not to drive, operate heavy machinery, perform activities at heights, swimming or participation in water activities or provide baby sitting services while on Pain, Sleep and Anxiety Medications; until his outpatient Physician has advised to do so again.  - Also recommended to not to take more than prescribed Pain, Sleep and Anxiety Medications.  Patient was seen by physical therapy, who recommended SNF, which was arranged. On the day of the discharge the patient's vitals were stable, and no other acute medical condition were reported by patient. the patient was felt safe to be discharge at [facility].  Consultants: Orthopedics Dr. Hessie Knows Procedures: None  Discharge Exam: General: Appear in no distress, no Rash; Oral Mucosa Clear, moist. Cardiovascular: S1 and S2 Present, no Murmur, Respiratory: normal respiratory effort, Bilateral Air entry present and no Crackles, no wheezes Abdomen:  Bowel Sound present, Soft and no tenderness, no hernia Extremities: no Pedal edema, no calf tenderness Neurology: alert and oriented to time,  place, and person affect appropriate.  Filed Weights   08/26/19 1803  Weight: 52.2 kg   Vitals:   08/30/19 0400 08/30/19 1030  BP: (!) 152/47 114/69  Pulse: (!) 59 (!) 57  Resp: 20   Temp: 98 F (36.7 C)   SpO2: 97%     DISCHARGE MEDICATION: Allergies as of 08/30/2019   No Known Allergies     Medication List    TAKE these medications   acetaminophen 325 MG tablet Commonly known as: TYLENOL Take 2 tablets (650 mg total) by mouth every 6 (six) hours as needed for mild pain, fever or headache (or Fever >/= 101).   Cholecalciferol 25 MCG (1000 UT) tablet Take 1,000 Units by mouth daily.   EMERGEN-C VITAMIN C PO Take 1 tablet by mouth daily at 12 noon.   folic acid 1 MG tablet Commonly known as: FOLVITE Take 1 mg by mouth daily.   galantamine 8 MG tablet Commonly known as: RAZADYNE Take 8 mg by mouth 2 (two) times daily.   hydrOXYzine 25 MG tablet Commonly known as: ATARAX/VISTARIL Take 25 mg by mouth 2 (two) times daily.   NON FORMULARY Take 2 capsules by mouth daily before breakfast. Herbal supplement: Triveratrol Gold   propranolol 20 MG tablet Commonly known as: INDERAL Take 1 tablet (20 mg total) by mouth daily at 12 noon. Hold if SBP <120 and or HR <65 What changed: additional instructions   tamsulosin 0.4 MG Caps capsule Commonly known as: FLOMAX Take 0.4 mg by mouth daily at 12 noon.      No Known Allergies Discharge Instructions    Diet - low sodium heart healthy   Complete by: As directed    Discharge instructions   Complete by: As directed    Patient will be seen by physician in 1 to 2 days, continue to monitor BP and heart rate.  Patient is on propranolol, follow parameters. Follow-up with orthopedic surgery as an outpatient in 1 to 2 weeks.   Increase activity slowly   Complete by: As directed       The results of significant diagnostics from this hospitalization (including imaging, microbiology, ancillary and laboratory) are listed  below for reference.    Significant Diagnostic Studies: DG Chest 2 View  Result Date: 08/26/2019 CLINICAL DATA:  Chest pain, fall EXAM: CHEST - 2 VIEW COMPARISON:  None. FINDINGS: The heart size and mediastinal contours are within normal limits. Aortic knob calcifications. Elevation of the left hemidiaphragm with gastric bubble and air-filled loops of bowel. Degenerative changes in the midthoracic spine with anterior flowing osteophytes. IMPRESSION: No active cardiopulmonary disease. Electronically Signed   By: Jonna Clark M.D.   On: 08/26/2019 18:45   CT HEAD WO CONTRAST  Result Date: 08/26/2019 CLINICAL DATA:  Head trauma, headache EXAM: CT HEAD WITHOUT CONTRAST TECHNIQUE: Contiguous axial images were obtained from the base of the skull through the vertex without intravenous contrast. COMPARISON:  None. FINDINGS: Brain: No evidence of acute territorial infarction, hemorrhage, hydrocephalus,extra-axial collection or mass lesion/mass effect. There is dilatation the ventricles and sulci consistent with age-related atrophy. Low-attenuation changes in the deep white matter consistent with small vessel ischemia. Vascular: No hyperdense vessel or unexpected calcification. Skull: The skull is intact. No fracture or focal lesion identified. Sinuses/Orbits: The visualized paranasal sinuses and mastoid air cells are clear. The orbits and globes intact. Other:  None IMPRESSION: No acute intracranial abnormality. Findings consistent with age related atrophy and chronic small vessel ischemia Electronically Signed   By: Jonna Clark M.D.   On: 08/26/2019 18:39   CT Chest W Contrast  Result Date: 08/26/2019 CLINICAL DATA:  Abdominal trauma, fall 1 week ago, continued pain EXAM: CT CHEST WITH CONTRAST TECHNIQUE: Multidetector CT imaging of the chest was performed during intravenous contrast administration. CONTRAST:  OMNIPAQUE IOHEXOL 300 MG/ML  SOLN COMPARISON:  None. FINDINGS: Cardiovascular: Normal heart size.  No significant pericardial fluid/thickening. Great vessels are normal in course and caliber. No evidence of acute thoracic aortic injury. No central pulmonary emboli. There is mild aneurysmal dilatation of the ascending intrathoracic aorta measuring 4 cm in maximum transverse dimension which tapers at the level of the aortic arch. Scattered aortic atherosclerosis is seen. Coronary artery calcifications are noted. Mediastinum/Nodes: No pneumomediastinum. No mediastinal hematoma. Unremarkable esophagus. No axillary, mediastinal or hilar lymphadenopathy. Lungs/Pleura:Minimal atelectasis or scarring is seen at the left lung base. There is elevation of the left hemidiaphragm. No pneumothorax. No pleural effusion. Musculoskeletal: There is an S-shaped scoliotic curvature of the thoracolumbar spine. There is a nondisplaced fracture seen through the anteroinferior endplate of the T6 vertebral body which extends through the mid vertebral body. No extension to the posterior elements is seen. No malalignment is noted. Superior compression deformity of the T8 vertebral bodies seen with less than 25% loss in height. No retropulsion of fragments is seen. There is diffuse osteopenia. Degenerative changes are seen through the thoracolumbar spine. Abdomen/pelvis: Hepatobiliary: Homogeneous hepatic attenuation without traumatic injury. No focal lesion. Gallbladder physiologically distended, no calcified stone. No biliary dilatation. Pancreas: No evidence for traumatic injury. Portions are partially obscured by adjacent bowel loops and paucity of intra-abdominal fat. No ductal dilatation or inflammation. Spleen: Homogeneous attenuation without traumatic injury. Normal in size. Adrenals/Urinary Tract: No adrenal hemorrhage. Kidneys demonstrate symmetric enhancement and excretion on delayed phase imaging. No evidence or renal injury. Ureters are well opacified proximal through mid portion. Bladder is physiologically distended without  wall thickening. Stomach/Bowel: Suboptimally assessed without enteric contrast, allowing for this, no evidence of bowel injury. Stomach physiologically distended. There are no dilated or thickened small or large bowel loops. Moderate stool burden. No evidence of mesenteric hematoma. No free air free fluid. Vascular/Lymphatic: No acute vascular injury. The abdominal aorta and IVC are intact. No evidence of retroperitoneal, abdominal, or pelvic adenopathy. Scattered aortic atherosclerosis is noted. For Reproductive: No acute abnormality. Other: No focal contusion or abnormality of the abdominal wall. Musculoskeletal: No acute fracture of the lumbar spine or bony pelvis. IMPRESSION: 1. Nondisplaced fracture through the anteroinferior endplate and mid body of T6. No extension to the posterior elements or malalignment. 2. Superior compression deformity of the T8 vertebral body with less than 25% loss in height. 3. No acute intrathoracic, abdominal, or pelvic injury. 4. 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 Recommend annual imaging followup by CTA. This recommendation follows 2010 ACCF/AHA/AATS/ACR/ASA/SCA/SCAI/SIR/STS/SVM Guidelines for the Diagnosis and Management of Patients with Thoracic Aortic Disease. Circulation. 2010; 121: C623-J628. Aortic aneurysm NOS (ICD10-I71.9) 5.  Aortic Atherosclerosis (ICD10-I70.0). Electronically Signed   By: Jonna Clark M.D.   On: 08/26/2019 21:15   CT Cervical Spine Wo Contrast  Result Date: 08/26/2019 CLINICAL DATA:  Fall 1 week ago with persistent neck pain, initial encounter EXAM: CT CERVICAL SPINE WITHOUT CONTRAST TECHNIQUE: Multidetector CT imaging of the cervical spine was performed without intravenous contrast. Multiplanar CT image reconstructions were also  generated. COMPARISON:  None. FINDINGS: Alignment: Within normal limits. Skull base and vertebrae: 7 cervical segments are well visualized. Vertebral body height is well  maintained. Multilevel facet hypertrophic changes are seen as well as osteophytic changes. No acute fracture or acute facet abnormality is noted. Disc space narrowing is seen throughout the cervical spine. Soft tissues and spinal canal: Vascular calcifications are seen. No focal hematoma is noted. No other soft tissue abnormality is noted. Upper chest: Visualized lung apices are within normal limits. Other: None IMPRESSION: Multilevel degenerative change without acute abnormality. Electronically Signed   By: Alcide Clever M.D.   On: 08/26/2019 21:09   MR THORACIC SPINE WO CONTRAST  Result Date: 08/28/2019 CLINICAL DATA:  Fall 1 week ago, thoracic pain. EXAM: MRI THORACIC SPINE WITHOUT CONTRAST TECHNIQUE: Multiplanar, multisequence MR imaging of the thoracic spine was performed. No intravenous contrast was administered. COMPARISON:  CT chest from 08/26/2019 FINDINGS: Alignment:  Dextroconvex thoracic scoliosis with rotary component. Vertebrae: Subtle nondisplaced fracture of the T6 vertebral level with components along both the inferior and superior endplate currently there is minimal loss of height, less than 10%. Remote 25% superior endplate compression fracture at T8 without any vertebral edema at the T8 level. No findings of posterior bony retropulsion. Cord:  No significant abnormal spinal cord signal is observed. Paraspinal and other soft tissues: No significant paraspinal hematoma at the level of the T6 fracture. There is a trace left pleural effusion and probably a trace amount of perisplenic fluid. Disc levels: No appreciable impingement in the thoracic spine. There is likely mild left foraminal impingement at the L1-2 level due to a left foraminal disc protrusion as suggested on image 18/19. IMPRESSION: 1. Subtle nondisplaced fracture of the T6 vertebral level with minimal loss of height, less than 10%. 2. Remote 25% superior endplate compression fracture at T7. 3. Mild left foraminal impingement at L1-2  due to a left foraminal disc protrusion. 4. Dextroconvex thoracic scoliosis with rotary component. 5. Trace left pleural effusion and trace perisplenic fluid. Electronically Signed   By: Gaylyn Rong M.D.   On: 08/28/2019 19:09   CT ABDOMEN PELVIS W CONTRAST  Result Date: 08/26/2019 CLINICAL DATA:  Abdominal trauma, fall 1 week ago, continued pain EXAM: CT CHEST WITH CONTRAST TECHNIQUE: Multidetector CT imaging of the chest was performed during intravenous contrast administration. CONTRAST:  OMNIPAQUE IOHEXOL 300 MG/ML  SOLN COMPARISON:  None. FINDINGS: Cardiovascular: Normal heart size. No significant pericardial fluid/thickening. Great vessels are normal in course and caliber. No evidence of acute thoracic aortic injury. No central pulmonary emboli. There is mild aneurysmal dilatation of the ascending intrathoracic aorta measuring 4 cm in maximum transverse dimension which tapers at the level of the aortic arch. Scattered aortic atherosclerosis is seen. Coronary artery calcifications are noted. Mediastinum/Nodes: No pneumomediastinum. No mediastinal hematoma. Unremarkable esophagus. No axillary, mediastinal or hilar lymphadenopathy. Lungs/Pleura:Minimal atelectasis or scarring is seen at the left lung base. There is elevation of the left hemidiaphragm. No pneumothorax. No pleural effusion. Musculoskeletal: There is an S-shaped scoliotic curvature of the thoracolumbar spine. There is a nondisplaced fracture seen through the anteroinferior endplate of the T6 vertebral body which extends through the mid vertebral body. No extension to the posterior elements is seen. No malalignment is noted. Superior compression deformity of the T8 vertebral bodies seen with less than 25% loss in height. No retropulsion of fragments is seen. There is diffuse osteopenia. Degenerative changes are seen through the thoracolumbar spine. Abdomen/pelvis: Hepatobiliary: Homogeneous hepatic attenuation without traumatic injury.  No focal lesion. Gallbladder physiologically distended, no calcified stone. No biliary dilatation. Pancreas: No evidence for traumatic injury. Portions are partially obscured by adjacent bowel loops and paucity of intra-abdominal fat. No ductal dilatation or inflammation. Spleen: Homogeneous attenuation without traumatic injury. Normal in size. Adrenals/Urinary Tract: No adrenal hemorrhage. Kidneys demonstrate symmetric enhancement and excretion on delayed phase imaging. No evidence or renal injury. Ureters are well opacified proximal through mid portion. Bladder is physiologically distended without wall thickening. Stomach/Bowel: Suboptimally assessed without enteric contrast, allowing for this, no evidence of bowel injury. Stomach physiologically distended. There are no dilated or thickened small or large bowel loops. Moderate stool burden. No evidence of mesenteric hematoma. No free air free fluid. Vascular/Lymphatic: No acute vascular injury. The abdominal aorta and IVC are intact. No evidence of retroperitoneal, abdominal, or pelvic adenopathy. Scattered aortic atherosclerosis is noted. For Reproductive: No acute abnormality. Other: No focal contusion or abnormality of the abdominal wall. Musculoskeletal: No acute fracture of the lumbar spine or bony pelvis. IMPRESSION: 1. Nondisplaced fracture through the anteroinferior endplate and mid body of T6. No extension to the posterior elements or malalignment. 2. Superior compression deformity of the T8 vertebral body with less than 25% loss in height. 3. No acute intrathoracic, abdominal, or pelvic injury. 4. 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 Recommend annual imaging followup by CTA. This recommendation follows 2010 ACCF/AHA/AATS/ACR/ASA/SCA/SCAI/SIR/STS/SVM Guidelines for the Diagnosis and Management of Patients with Thoracic Aortic Disease. Circulation. 2010; 121: M578-I696. Aortic aneurysm NOS (ICD10-I71.9) 5.   Aortic Atherosclerosis (ICD10-I70.0). Electronically Signed   By: Jonna Clark M.D.   On: 08/26/2019 21:15    Microbiology: Recent Results (from the past 240 hour(s))  Respiratory Panel by RT PCR (Flu A&B, Covid) - Nasopharyngeal Swab     Status: None   Collection Time: 08/26/19 10:25 PM   Specimen: Nasopharyngeal Swab  Result Value Ref Range Status   SARS Coronavirus 2 by RT PCR NEGATIVE NEGATIVE Final    Comment: (NOTE) SARS-CoV-2 target nucleic acids are NOT DETECTED. The SARS-CoV-2 RNA is generally detectable in upper respiratoy specimens during the acute phase of infection. The lowest concentration of SARS-CoV-2 viral copies this assay can detect is 131 copies/mL. A negative result does not preclude SARS-Cov-2 infection and should not be used as the sole basis for treatment or other patient management decisions. A negative result may occur with  improper specimen collection/handling, submission of specimen other than nasopharyngeal swab, presence of viral mutation(s) within the areas targeted by this assay, and inadequate number of viral copies (<131 copies/mL). A negative result must be combined with clinical observations, patient history, and epidemiological information. The expected result is Negative. Fact Sheet for Patients:  https://www.moore.com/ Fact Sheet for Healthcare Providers:  https://www.young.biz/ This test is not yet ap proved or cleared by the [place] FDA and  has been authorized for detection and/or diagnosis of SARS-CoV-2 by FDA under an Emergency Use Authorization (EUA). This EUA will remain  in effect (meaning this test can be used) for the duration of the COVID-19 declaration under Section 564(b)(1) of the Act, 21 U.S.C. section 360bbb-3(b)(1), unless the authorization is terminated or revoked sooner.    Influenza A by PCR NEGATIVE NEGATIVE Final   Influenza B by PCR NEGATIVE NEGATIVE Final    Comment:  (NOTE) The Xpert Xpress SARS-CoV-2/FLU/RSV assay is intended as an aid in  the diagnosis of influenza from Nasopharyngeal swab specimens and  should not be used as a sole basis for treatment.  Nasal washings and  aspirates are unacceptable for Xpert Xpress SARS-CoV-2/FLU/RSV  testing. Fact Sheet for Patients: https://www.moore.com/https://www.fda.gov/media/142436/download Fact Sheet for Healthcare Providers: https://www.young.biz/https://www.fda.gov/media/142435/download This test is not yet approved or cleared by the [place] FDA and  has been authorized for detection and/or diagnosis of SARS-CoV-2 by  FDA under an Emergency Use Authorization (EUA). This EUA will remain  in effect (meaning this test can be used) for the duration of the  Covid-19 declaration under Section 564(b)(1) of the Act, 21  U.S.C. section 360bbb-3(b)(1), unless the authorization is  terminated or revoked. Performed at [facility], [address] [place], [place] [address]   SARS CORONAVIRUS 2 (TAT 6-24 HRS) Nasopharyngeal Nasopharyngeal Swab     Status: None   Collection Time: 08/29/19  4:50 PM   Specimen: Nasopharyngeal Swab  Result Value Ref Range Status   SARS Coronavirus 2 NEGATIVE NEGATIVE Final    Comment: (NOTE) SARS-CoV-2 target nucleic acids are NOT DETECTED. The SARS-CoV-2 RNA is generally detectable in upper and lower respiratory specimens during the acute phase of infection. Negative results do not preclude SARS-CoV-2 infection, do not rule out co-infections with other pathogens, and should not be used as the sole basis for treatment or other patient management decisions. Negative results must be combined with clinical observations, patient history, and epidemiological information. The expected result is Negative. Fact Sheet for Patients: HairSlick.nohttps://www.fda.gov/media/138098/download Fact Sheet for Healthcare Providers: quierodirigir.comhttps://www.fda.gov/media/138095/download This test is not yet approved or cleared by the [place]  FDA and  has been authorized for detection and/or diagnosis of SARS-CoV-2 by FDA under an Emergency Use Authorization (EUA). This EUA will remain  in effect (meaning this test can be used) for the duration of the COVID-19 declaration under Section 56 4(b)(1) of the Act, 21 U.S.C. section 360bbb-3(b)(1), unless the authorization is terminated or revoked sooner. Performed at [facility], [address] [place], [place] [address]      Labs: CBC: Recent Labs  Lab 08/26/19 1809 08/27/19 0549 08/29/19 0404 08/30/19 0323  WBC 8.0 8.0 8.9 8.9  HGB 14.5 14.0 14.2 14.2  HCT 42.5 41.4 41.0 42.1  MCV 90.2 90.4 90.3 90.5  PLT 214 184 210 193   Basic Metabolic Panel: Recent Labs  Lab 08/26/19 1809 08/27/19 0549 08/29/19 0404 08/30/19 0323  NA 135 136 137 134*  K 4.7 3.9 4.3 4.1  CL 100 102 102 101  CO2 26 26 27 24   GLUCOSE 114* 98 115* 106*  BUN 30* 22 29* 29*  CREATININE 0.79 0.73 0.69 0.70  CALCIUM 9.5 8.9 9.1 9.0  MG  --   --  2.0  --   PHOS  --   --  3.1  --    Liver Function Tests: No results for input(s): AST, ALT, ALKPHOS, BILITOT, PROT, ALBUMIN in the last 168 hours. No results for input(s): LIPASE, AMYLASE in the last 168 hours. No results for input(s): AMMONIA in the last 168 hours. Cardiac Enzymes: No results for input(s): CKTOTAL, CKMB, CKMBINDEX, TROPONINI in the last 168 hours. BNP (last 3 results) No results for input(s): BNP in the last 8760 hours. CBG: No results for input(s): GLUCAP in the last 168 hours.  Time spent: 35 minutes  Signed:  Gillis SantaDileep Domingo Fuson  [facility]  08/30/2019 11:54 AM

## 2019-08-30 NOTE — TOC Transition Note (Signed)
Transition of Care [facility]) - CM/SW Discharge Note   Patient Details  Name: SERGI GELLNER MRN: [id number] Date of Birth: 11/10/1925  [facility] CM/SW Contact:  Margarito Liner, LCSW Phone Number: 08/30/2019, 1:24 PM   Clinical Narrative:  Patient has orders to discharge to [facility] today. RN will call report to [phone] (Room 505) before setting up EMS transport. No further concerns. CSW signing off.   Final next level of care: Skilled Nursing Facility Barriers to Discharge: Barriers Resolved   Patient Goals and CMS Choice   CMS Medicare.gov Compare Post Acute Care list provided to:: Patient(and wife at bedside) Choice offered to / list presented to : Patient, Spouse  Discharge Placement PASRR number recieved: 08/28/19            Patient chooses bed at: [facility] Patient to be transferred to facility by: EMS Name of family member notified: Leonie Green Patient and family notified of of transfer: 08/30/19  Discharge Plan and Services     Post Acute Care Choice: Skilled Nursing Facility                               Social Determinants of Health (SDOH) Interventions     Readmission Risk Interventions No flowsheet data found.

## 2019-09-16 DIAGNOSIS — S0101XA Laceration without foreign body of scalp, initial encounter: Secondary | ICD-10-CM

## 2019-09-16 DIAGNOSIS — S22000A Wedge compression fracture of unspecified thoracic vertebra, initial encounter for closed fracture: Secondary | ICD-10-CM

## 2019-09-16 DIAGNOSIS — W19XXXA Unspecified fall, initial encounter: Secondary | ICD-10-CM

## 2019-12-10 ENCOUNTER — Emergency Department
Admission: EM | Admit: 2019-12-10 | Discharge: 2019-12-10 | Disposition: A | Discharge status: Home / Self Care | Payer: Medicare Other | Attending: Emergency Medicine | Admitting: Emergency Medicine

## 2019-12-10 ENCOUNTER — Encounter: Payer: Self-pay | Admitting: Emergency Medicine

## 2019-12-10 ENCOUNTER — Emergency Department: Payer: Medicare Other

## 2019-12-10 ENCOUNTER — Other Ambulatory Visit: Payer: Self-pay

## 2019-12-10 DIAGNOSIS — M542 Cervicalgia: Secondary | ICD-10-CM | POA: Insufficient documentation

## 2019-12-10 DIAGNOSIS — Z79899 Other long term (current) drug therapy: Secondary | ICD-10-CM | POA: Diagnosis not present

## 2019-12-10 DIAGNOSIS — Y999 Unspecified external cause status: Secondary | ICD-10-CM | POA: Insufficient documentation

## 2019-12-10 DIAGNOSIS — W07XXXA Fall from chair, initial encounter: Secondary | ICD-10-CM | POA: Diagnosis not present

## 2019-12-10 DIAGNOSIS — Y9389 Activity, other specified: Secondary | ICD-10-CM | POA: Diagnosis not present

## 2019-12-10 DIAGNOSIS — W19XXXA Unspecified fall, initial encounter: Secondary | ICD-10-CM

## 2019-12-10 DIAGNOSIS — S0990XA Unspecified injury of head, initial encounter: Secondary | ICD-10-CM | POA: Diagnosis present

## 2019-12-10 DIAGNOSIS — Y9289 Other specified places as the place of occurrence of the external cause: Secondary | ICD-10-CM | POA: Diagnosis not present

## 2019-12-10 MED ORDER — LIDOCAINE 5 % EX PTCH
1.0000 | MEDICATED_PATCH | CUTANEOUS | Status: DC
  Administered 2019-12-10: 1 via TRANSDERMAL
  Filled 2019-12-10: qty 1

## 2019-12-10 NOTE — Discharge Instructions (Addendum)
Please follow up with your primary care tomorrow. Please return to the emergency department for worsening or change of symptoms.

## 2019-12-10 NOTE — ED Notes (Signed)
This RN spoke with Dr. Larinda Buttery regarding care, no orders received, per Dr. Larinda Buttery, okay for patient to go to flex.

## 2019-12-10 NOTE — ED Triage Notes (Signed)
In via EMS with c/o sliding out of chair into floor. Pt c/o back pain and has hx of chronic back pain. EMS reports pt is supposed to be wearing a back brace but is not wearing one.

## 2019-12-10 NOTE — ED Triage Notes (Signed)
Pt presents to ED via EMS, per pt's wife pt was sitting in chair at kitchen table and patient slid out of chair and onto floor. Pt presents and appears to be in NAD, hx of dementia at baseline. No obvious deformity or injury noted to back of his head at this time.   Pt's wife denies LOC at this time. Pt's wife reports pt is currently under community hospice for alzheimer's. Pt denies any pain at this time.

## 2019-12-10 NOTE — ED Provider Notes (Signed)
[facility] Emergency Department Provider Note  ____________________________________________  Time seen: Approximately 6:49 PM  I have reviewed the triage vital signs and the nursing notes.   HISTORY  Chief Complaint Fall    HPI Austin Ramos is a 86 y.o. male that presents to the emergency department for evaluation after sliding out of a chair today.  Wife states that she was trying to show patient a bill that they received from their 24-hour nursing care services. She was holding it up over his head and he was trying to look up to see it. She was trying to show him rather than tell him because someone was also in the room.  While he was trying to look upwards to see the bill, he slid out of the chair.  She said it was a very smooth slide.  She does not think that he hit his head.  He did not lose consciousness.  Patient was complaining of back pain following the incident.  He denies any pain currently, while at rest.  Patient states that he does have back pain when he moves and tries to roll.  No headache, shortness of breath, chest pain, abdominal pain.    Past Medical History:  Diagnosis Date  . BPH (benign prostatic hyperplasia)   . Hyperlipidemia     Patient Active Problem List   Diagnosis Date Noted  . Compression fracture of thoracic vertebra (HCC)   . Scalp laceration   . Fall   . Thoracic vertebral fracture (HCC) 08/26/2019    History reviewed. No pertinent surgical history.  Prior to Admission medications   Medication Sig Start Date End Date Taking? Authorizing Provider  acetaminophen (TYLENOL) 325 MG tablet Take 2 tablets (650 mg total) by mouth every 6 (six) hours as needed for mild pain, fever or headache (or Fever >/= 101). 08/30/19   Gillis SantaKumar, Dileep, MD  Cholecalciferol 25 MCG (1000 UT) tablet Take 1,000 Units by mouth daily.    [provider]  folic acid (FOLVITE) 1 MG tablet Take 1 mg by mouth daily. 05/31/19   [provider]  galantamine (RAZADYNE) 8 MG tablet Take 8 mg by mouth 2 (two) times daily. 08/24/19   [provider]  hydrOXYzine (ATARAX/VISTARIL) 25 MG tablet Take 25 mg by mouth 2 (two) times daily. 08/24/19   [provider]  Multiple Vitamins-Minerals (EMERGEN-C VITAMIN C PO) Take 1 tablet by mouth daily at 12 noon.     [provider]  NON FORMULARY Take 2 capsules by mouth daily before breakfast. Herbal supplement: Triveratrol Gold     [provider]  propranolol (INDERAL) 20 MG tablet Take 1 tablet (20 mg total) by mouth daily at 12 noon. Hold if SBP <120 and or HR <65 08/30/19   Gillis SantaKumar, Dileep, MD  tamsulosin (FLOMAX) 0.4 MG CAPS capsule Take 0.4 mg by mouth daily at 12 noon.  08/06/19   [provider]    Allergies Patient has no known allergies.  History reviewed. No pertinent family history.  Social History Social History   Tobacco Use  . Smoking status: Never Smoker  . Smokeless tobacco: Never Used  Substance Use Topics  . Alcohol use: Never  . Drug use: Never     Review of Systems  Cardiovascular: No chest pain. Respiratory: No SOB. Gastrointestinal: No abdominal pain.  No vomiting.  Musculoskeletal: Positive for back pain with movement. Skin: Negative for rash, abrasions, lacerations, ecchymosis. Neurological: Negative for headache   ____________________________________________  PHYSICAL EXAM:  VITAL SIGNS: ED Triage Vitals [12/10/19 1527]  Enc Vitals Group     BP (!) 118/57     Pulse Rate 65     Resp 20     Temp 98 F (36.7 C)     Temp Source Oral     SpO2 95 %     Weight 125 lb (56.7 kg)     Height 5\' 10"  (1.778 m)     Head Circumference      Peak Flow      Pain Score      Pain Loc      Pain Edu?      Excl. in GC?      Constitutional: Alert and oriented. Well appearing and in no acute distress. Eyes: Conjunctivae are normal. PERRL. EOMI. Head: Atraumatic. ENT:      Ears:      Nose: No  congestion/rhinnorhea.      Mouth/Throat: Mucous membranes are moist.  Neck: No stridor. Cardiovascular: Normal rate, regular rhythm.  Good peripheral circulation. Respiratory: Normal respiratory effort without tachypnea or retractions. Lungs CTAB. Good air entry to the bases with no decreased or absent breath sounds. Gastrointestinal: Bowel sounds 4 quadrants. Soft and nontender to palpation. No guarding or rigidity. No palpable masses. No distention.  Musculoskeletal: Full range of motion to all extremities. No gross deformities appreciated.  No pinpoint tenderness to palpation throughout thoracic or lumbar spine.  No tenderness to palpation over bilateral hips.  Full range of motion of bilateral hips. Neurologic:  Normal speech and language. No gross focal neurologic deficits are appreciated.  Skin:  Skin is warm, dry and intact. No rash noted. Psychiatric: Mood and affect are normal. Speech and behavior are normal. Patient exhibits appropriate insight and judgement.   ____________________________________________   LABS (all labs ordered are listed, but only abnormal results are displayed)  Labs Reviewed - No data to display ____________________________________________  EKG   ____________________________________________  RADIOLOGY , personally viewed and evaluated these images (plain radiographs) as part of my medical decision making, as well as reviewing the written report by the radiologist.  DG Chest 1 View  Result Date: 12/10/2019 CLINICAL DATA:  Shortness of breath. Fall today. EXAM: CHEST  1 VIEW COMPARISON:  08/26/2019 FINDINGS: Patient is rotated to the left. The heart size and mediastinal contours are within normal limits. Aortic atherosclerosis noted. Chronic elevation of left hemidiaphragm is stable. Mild linear opacity in the left lung base was not seen on previous study, and may be due to mild atelectasis or scarring. No evidence of pulmonary  consolidation or edema. No evidence of pleural effusion. IMPRESSION: Mild left basilar atelectasis versus scarring. Stable elevation of left hemidiaphragm. Electronically Signed   By: 10/24/2019 M.D.   On: 12/10/2019 17:28   DG Pelvis 1-2 Views  Result Date: 12/10/2019 CLINICAL DATA:  Fall out of chair at home today. Pelvic pain. Initial encounter. EXAM: PELVIS - 1-2 VIEW COMPARISON:  None. FINDINGS: There is no evidence of pelvic fracture or diastasis. No pelvic bone lesions are seen. IMPRESSION: Negative. Electronically Signed   By: 12/12/2019 M.D.   On: 12/10/2019 17:29   CT Head Wo Contrast  Result Date: 12/10/2019 CLINICAL DATA:  Neck pain. Slid out of his chair onto the floor today. EXAM: CT HEAD WITHOUT CONTRAST CT CERVICAL SPINE WITHOUT CONTRAST TECHNIQUE: Multidetector CT imaging of the head and cervical spine was performed following the standard protocol without intravenous contrast. Multiplanar CT image reconstructions  of the cervical spine were also generated. COMPARISON:  Cervical spine and head CTs dated 08/26/2019. FINDINGS: CT HEAD FINDINGS Brain: No significant change in moderate to marked diffuse enlargement of the ventricles and subarachnoid spaces. Stable minimal patchy white matter low density in both cerebral hemispheres. No intracranial hemorrhage, mass lesion or CT evidence of acute infarction. Vascular: No hyperdense vessel or unexpected calcification. Skull: Normal. Negative for fracture or focal lesion. Sinuses/Orbits: Status post bilateral cataract extraction. Unremarkable bones and included paranasal sinuses. Other: None. CT CERVICAL SPINE FINDINGS Alignment: Stable mild anterior and posterior subluxations at multiple levels. No acute subluxations. Skull base and vertebrae: No acute fracture. No primary bone lesion or focal pathologic process. Soft tissues and spinal canal: No prevertebral fluid or swelling. No visible canal hematoma. Disc levels:  Multilevel degenerative  changes. Upper chest: Mild biapical pleural and parenchymal scarring. Other: Bilateral carotid artery calcifications. IMPRESSION: 1. No skull fracture or intracranial hemorrhage. 2. No cervical spine fracture or traumatic subluxation. 3. Stable moderate to marked diffuse cerebral and cerebellar atrophy. 4. Stable minimal chronic small vessel white matter ischemic changes in both cerebral hemispheres. 5. Multilevel cervical spine degenerative changes. 6. Bilateral carotid artery atheromatous calcifications. Electronically Signed   By: Beckie Salts M.D.   On: 12/10/2019 17:11   CT Cervical Spine Wo Contrast  Result Date: 12/10/2019 CLINICAL DATA:  Neck pain. Slid out of his chair onto the floor today. EXAM: CT HEAD WITHOUT CONTRAST CT CERVICAL SPINE WITHOUT CONTRAST TECHNIQUE: Multidetector CT imaging of the head and cervical spine was performed following the standard protocol without intravenous contrast. Multiplanar CT image reconstructions of the cervical spine were also generated. COMPARISON:  Cervical spine and head CTs dated 08/26/2019. FINDINGS: CT HEAD FINDINGS Brain: No significant change in moderate to marked diffuse enlargement of the ventricles and subarachnoid spaces. Stable minimal patchy white matter low density in both cerebral hemispheres. No intracranial hemorrhage, mass lesion or CT evidence of acute infarction. Vascular: No hyperdense vessel or unexpected calcification. Skull: Normal. Negative for fracture or focal lesion. Sinuses/Orbits: Status post bilateral cataract extraction. Unremarkable bones and included paranasal sinuses. Other: None. CT CERVICAL SPINE FINDINGS Alignment: Stable mild anterior and posterior subluxations at multiple levels. No acute subluxations. Skull base and vertebrae: No acute fracture. No primary bone lesion or focal pathologic process. Soft tissues and spinal canal: No prevertebral fluid or swelling. No visible canal hematoma. Disc levels:  Multilevel degenerative  changes. Upper chest: Mild biapical pleural and parenchymal scarring. Other: Bilateral carotid artery calcifications. IMPRESSION: 1. No skull fracture or intracranial hemorrhage. 2. No cervical spine fracture or traumatic subluxation. 3. Stable moderate to marked diffuse cerebral and cerebellar atrophy. 4. Stable minimal chronic small vessel white matter ischemic changes in both cerebral hemispheres. 5. Multilevel cervical spine degenerative changes. 6. Bilateral carotid artery atheromatous calcifications. Electronically Signed   By: Beckie Salts M.D.   On: 12/10/2019 17:11   CT Thoracic Spine Wo Contrast  Result Date: 12/10/2019 CLINICAL DATA:  86 year old male status post fall onto floor. History of T6 vertebral fracture in February. EXAM: CT THORACIC SPINE WITHOUT CONTRAST TECHNIQUE: Multidetector CT images of the thoracic were obtained using the standard protocol without intravenous contrast. COMPARISON:  Thoracic spine MRI 08/28/2019. CT Chest, Abdomen, and Pelvis 08/26/2019 FINDINGS: Limited cervical spine imaging: Stable mild degenerative appearing anterolisthesis at the cervicothoracic junction. Chronic facet hypertrophy, possible developing facet ankylosis on the right. Thoracic spine segmentation:  Normal. Alignment: Chronic dextro thoracic scoliosis. Stable exaggerated upper thoracic kyphosis since February. No  significant thoracic spondylolisthesis. Vertebrae: Intermittent thoracic spine ankylosis via flowing endplate osteophytes. A fracture through the T6 level occurred in February as described on the comparison MRI. But only minimal fracture lucency remains visible along the anterior T6 vertebra (coronal series 7, image 54 today). No interval loss of T6 body height and stable regional alignment. Stable mild chronic T8 compression fracture. No new osseous abnormality identified in the thoracic spine. Visible posterior ribs appear intact. Paraspinal and other soft tissues: No cardiomegaly or  pericardial effusion. Calcified coronary artery and Calcified aortic atherosclerosis. Stable and negative visible upper abdominal viscera. Chronic scarring and/or atelectasis in the left lower lobe. No visible pleural effusion or acute pulmonary finding. Thoracic paraspinal soft tissues are within normal limits. Disc levels: Capacious thoracic spinal canal. No CT evidence of thoracic spinal stenosis. IMPRESSION: 1. Near complete healing of the February T6 vertebral fracture - which occured on a background of thoracic spine ankylosis - with no new osseous abnormality identified. 2. Mild chronic T8 compression fracture. 3. Aortic Atherosclerosis (ICD10-I70.0). Electronically Signed   By: Genevie Ann M.D.   On: 12/10/2019 17:24   CT Lumbar Spine Wo Contrast  Result Date: 12/10/2019 CLINICAL DATA:  86 year old male status post fall onto floor. History of T6 vertebral fracture in February. EXAM: CT LUMBAR SPINE WITHOUT CONTRAST TECHNIQUE: Multidetector CT imaging of the lumbar spine was performed without intravenous contrast administration. Multiplanar CT image reconstructions were also generated. COMPARISON:  Thoracic spine CT today, thoracic spine MRI 08/28/2019. CT Chest, Abdomen, and Pelvis 08/26/2019. FINDINGS: Segmentation: Normal. Alignment: Stable since February. Levoconvex lumbar scoliosis with relatively preserved lordosis. Mild grade 1 anterolisthesis at L3-L4 and L4-L5, with mild retrolisthesis at L5-S1. Vertebrae: No acute osseous abnormality identified. Visible sacrum and SI joints appear intact. Incidental right sacral Tarlov cyst (normal variant). Paraspinal and other soft tissues: Calcified aortic atherosclerosis. Stable and negative visible noncontrast abdominal viscera. Lumbar paraspinal soft tissues are within normal limits. Disc levels: The dominant degenerative finding is lumbar facet arthropathy in the setting of multilevel mild spondylolisthesis. Occasional lumbar vacuum disc. Lumbar spine  degeneration appears stable since February, with up to mild degenerative spinal stenosis. IMPRESSION: 1. No acute osseous abnormality in the lumbar spine. 2. Levoconvex lumbar scoliosis and mild multilevel spondylolisthesis with facet arthropathy. 3. Aortic Atherosclerosis (ICD10-I70.0). Electronically Signed   By: Genevie Ann M.D.   On: 12/10/2019 17:28    ____________________________________________    PROCEDURES  Procedure(s) performed:    Procedures    Medications  lidocaine (LIDODERM) 5 % 1 patch (1 patch Transdermal Patch Applied 12/10/19 1815)     ____________________________________________   INITIAL IMPRESSION / ASSESSMENT AND PLAN / ED COURSE  Pertinent labs & imaging results that were available during my care of the patient were reviewed by me and considered in my medical decision making (see chart for details).  Review of the [place] CSRS was performed in accordance of the [place] prior to dispensing any controlled drugs.   Patient presented to the emergency department for evaluation after mechanical injury of sliding out of a chair today while trying to view a piece of paper that his wife was holding up.  Vital signs and exam are reassuring.  Head CT, cervical CT, thoracic CT, lumbar CT, chest x-ray, pelvis x-ray are negative for acute abnormalities.  Patient overall appears well and he denies any pain currently.  Patient is to follow up with primary care as directed. Patient is given ED precautions to return to the ED for any worsening or  new symptoms.  Austin Ramos was evaluated in Emergency Department on 12/10/2019 for the symptoms described in the history of present illness. He was evaluated in the context of the global COVID-19 pandemic, which necessitated consideration that the patient might be at risk for infection with the SARS-CoV-2 virus that causes COVID-19. Institutional protocols and algorithms that pertain to the evaluation of patients at risk for COVID-19 are in a  state of rapid change based on information released by regulatory bodies including the CDC and federal and state organizations. These policies and algorithms were followed during the patient's care in the ED.   ____________________________________________  FINAL CLINICAL IMPRESSION(S) / ED DIAGNOSES  Final diagnoses:  Fall, initial encounter      NEW MEDICATIONS STARTED DURING THIS VISIT:  ED Discharge Orders    None          This chart was dictated using voice recognition software/Dragon. Despite best efforts to proofread, errors can occur which can change the meaning. Any change was purely unintentional.    Enid Derry, PA-C 12/10/19 2240    Sharman Cheek, MD 12/13/19 [phone]

## 2019-12-10 NOTE — ED Notes (Signed)
Pt up to bathroom with EDT  Informed the wife of the wait

## 2019-12-10 NOTE — ED Notes (Signed)
See triage note   Per wife he slid out of chair in the kitchen  NAD   Wife at bedside   Pt has hx of Dementia

## 2020-05-25 DEATH — deceased

## 2021-06-24 IMAGING — CT CT HEAD W/O CM
3 of 4 series · 14 of 47 positions shown, 16 images · non-contrast
Comparison: Cervical spine and head CTs dated 08/26/2019.

CLINICAL DATA: Neck pain. Slid out of his chair onto the floor
today.

EXAM:
CT HEAD WITHOUT CONTRAST
CT CERVICAL SPINE WITHOUT CONTRAST
TECHNIQUE: Multidetector CT imaging of the head and cervical spine was
performed following the standard protocol without intravenous
contrast. Multiplanar CT image reconstructions of the cervical spine
were also generated.

[Series 3: head wo · axial · 0.44mm/px · z∈[-125,-5]mm · 8 of 30 slices shown, 10 images]
[im 3/30  brain]
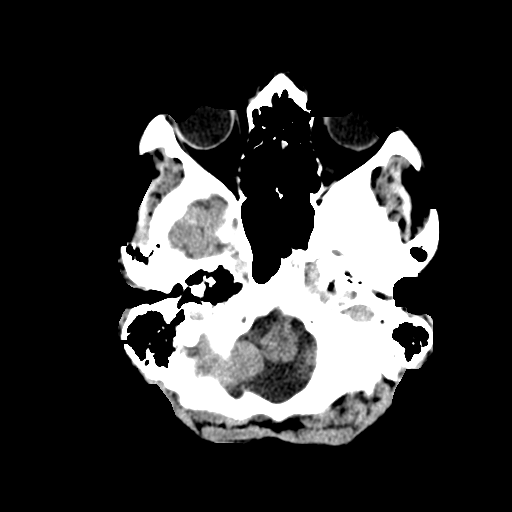
[im 3/30  bone]
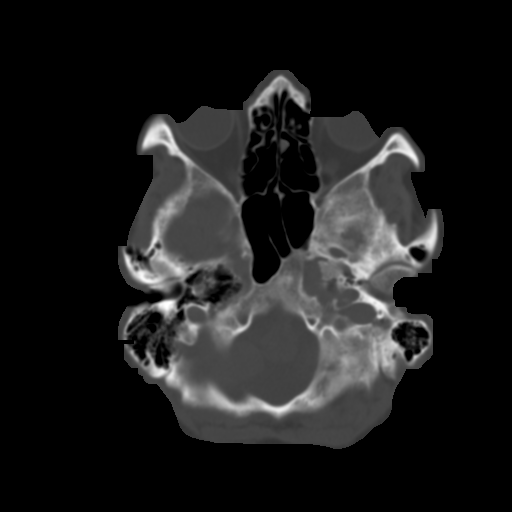
[im 6/30  brain]
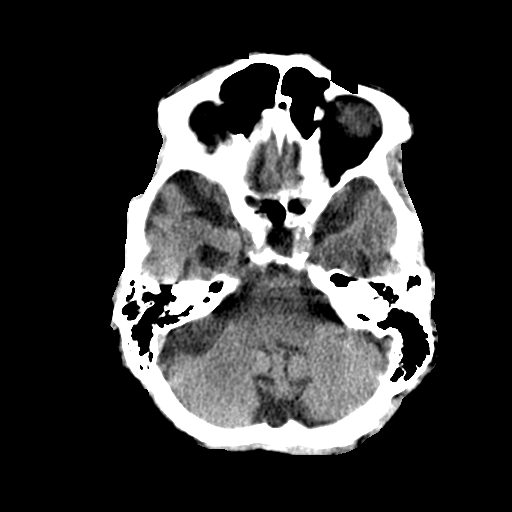
[im 10/30  brain]
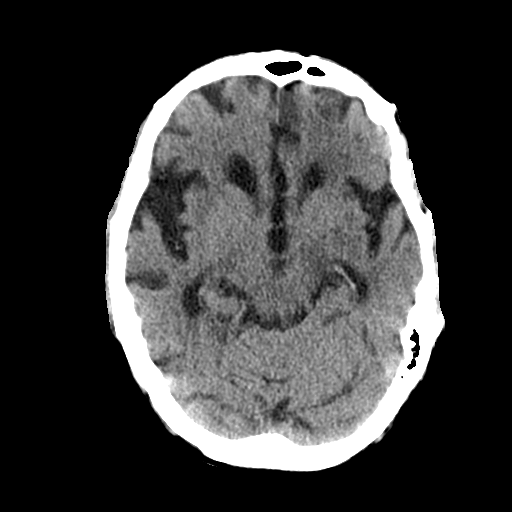
[im 13/30  brain]
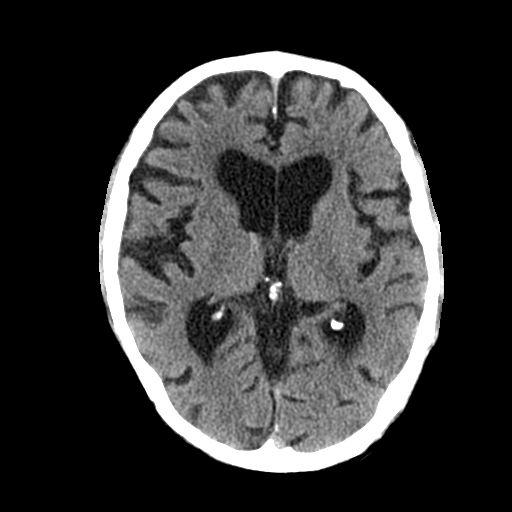
[im 17/30  brain]
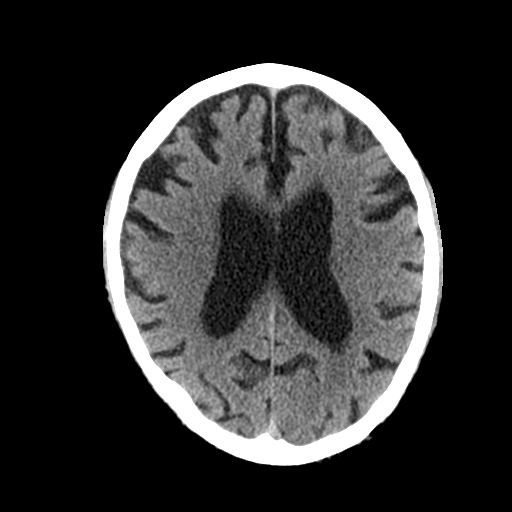
[im 17/30  bone]
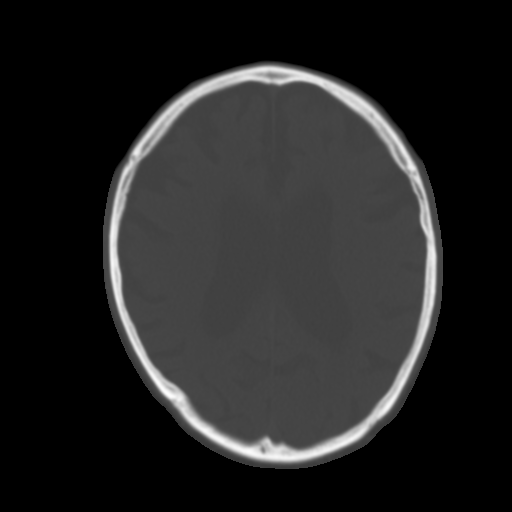
[im 20/30  brain]
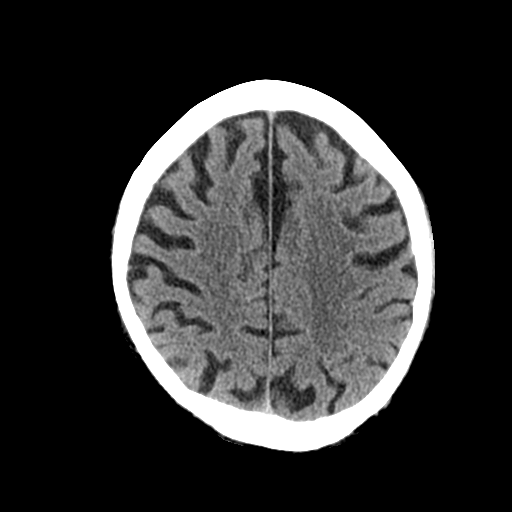
[im 24/30  brain]
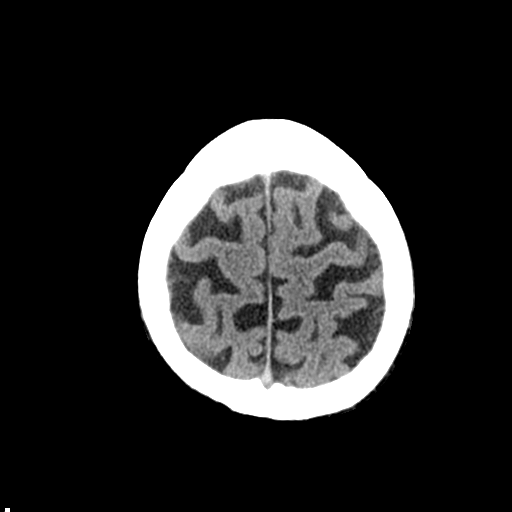
[im 27/30  brain]
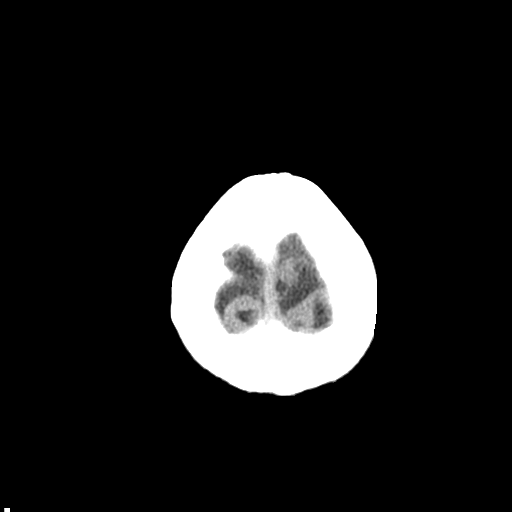

[Series 4: coronal soft tissue · coronal · 0.29mm/px · 3 of 63 slices shown]
[im 21/63  brain]
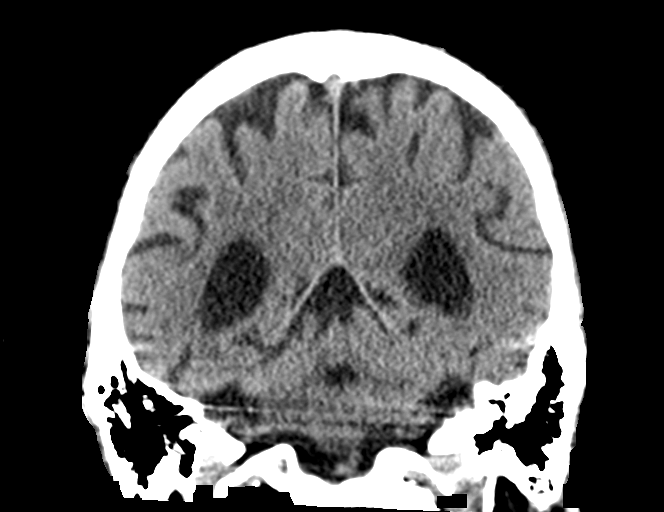
[im 28/63  brain]
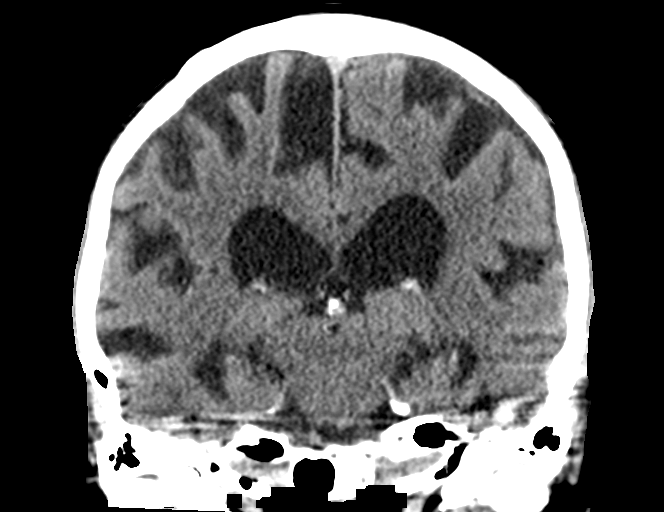
[im 35/63  brain]
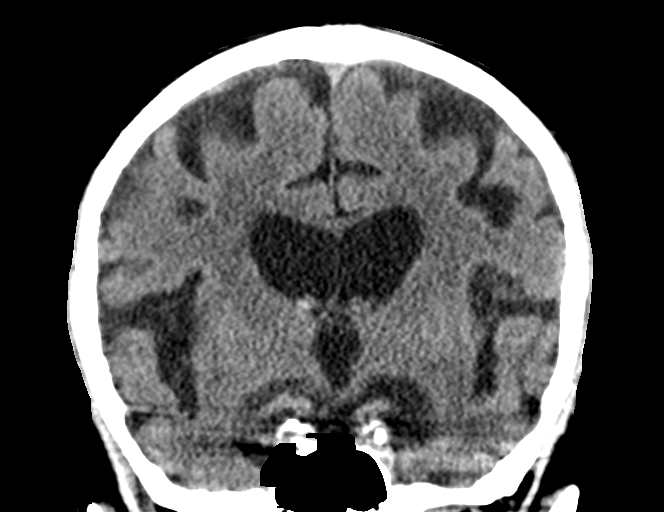

[Series 7: sagittal soft tissue · sagittal · 0.30mm/px · 3 of 54 slices shown]
[im 18/54  brain]
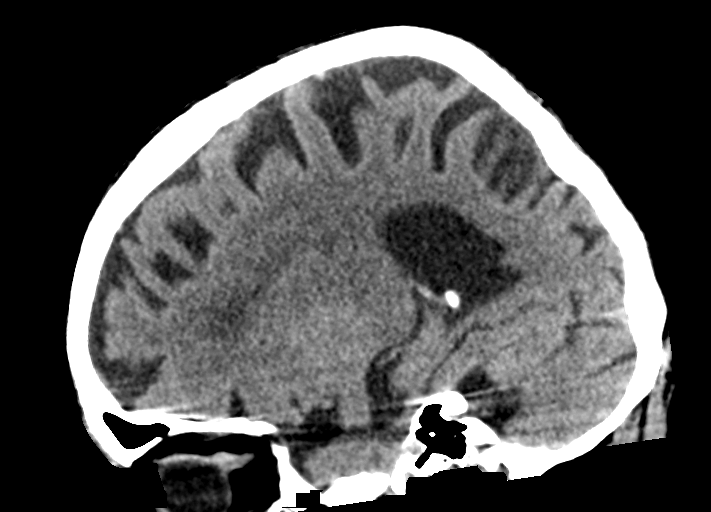
[im 27/54  brain]
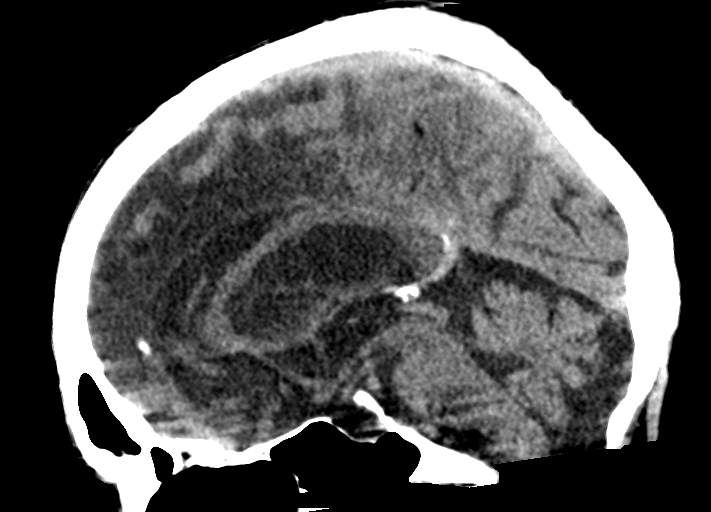
[im 36/54  brain]
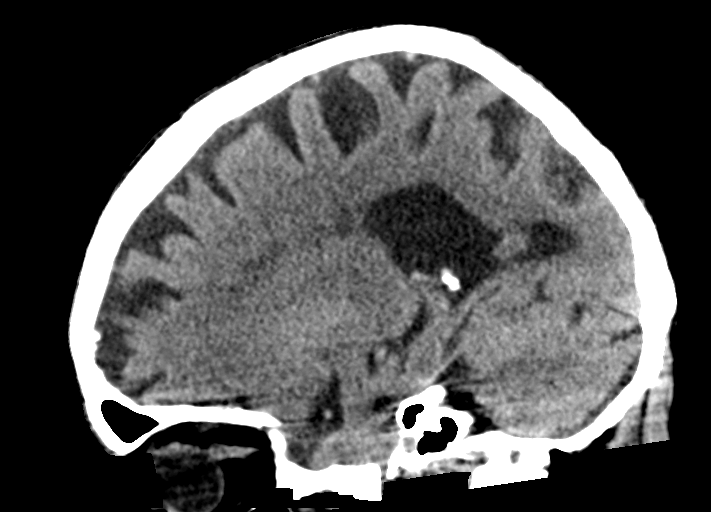

[14 of 47 positions shown; findings below may reference images not displayed]

FINDINGS: CT HEAD FINDINGS

Brain: No significant change in moderate to marked diffuse
enlargement of the ventricles and subarachnoid spaces. Stable
minimal patchy white matter low density in both cerebral
hemispheres. No intracranial hemorrhage, mass lesion or CT evidence
of acute infarction.

Vascular: No hyperdense vessel or unexpected calcification.

Skull: Normal. Negative for fracture or focal lesion.

Sinuses/Orbits: Status post bilateral cataract extraction.
Unremarkable bones and included paranasal sinuses.

Other: None.

CT CERVICAL SPINE FINDINGS

Alignment: Stable mild anterior and posterior subluxations at
multiple levels. No acute subluxations.

Skull base and vertebrae: No acute fracture. No primary bone lesion
or focal pathologic process.

Soft tissues and spinal canal: No prevertebral fluid or swelling. No
visible canal hematoma.

Disc levels:  Multilevel degenerative changes.

Upper chest: Mild biapical pleural and parenchymal scarring.

Other: Bilateral carotid artery calcifications.
IMPRESSION: 1. No skull fracture or intracranial hemorrhage.
2. No cervical spine fracture or traumatic subluxation.
3. Stable moderate to marked diffuse cerebral and cerebellar
atrophy.
4. Stable minimal chronic small vessel white matter ischemic changes
in both cerebral hemispheres.
5. Multilevel cervical spine degenerative changes.
6. Bilateral carotid artery atheromatous calcifications.
